# Patient Record
Sex: Male | Born: 1965 | Race: Black or African American | Hispanic: No | Marital: Married | Smoking: Never smoker
Health system: Southern US, Community
[De-identification: ages and names within clinical notes are randomized; demographics above are authoritative.]

## PROBLEM LIST (undated history)

## (undated) DIAGNOSIS — I10 Essential (primary) hypertension: Secondary | ICD-10-CM

## (undated) DIAGNOSIS — E785 Hyperlipidemia, unspecified: Secondary | ICD-10-CM

## (undated) DIAGNOSIS — E782 Mixed hyperlipidemia: Secondary | ICD-10-CM

## (undated) DIAGNOSIS — E0800 Diabetes mellitus due to underlying condition with hyperosmolarity without nonketotic hyperglycemic-hyperosmolar coma (NKHHC): Secondary | ICD-10-CM

## (undated) DIAGNOSIS — G4730 Sleep apnea, unspecified: Secondary | ICD-10-CM

## (undated) DIAGNOSIS — G4733 Obstructive sleep apnea (adult) (pediatric): Secondary | ICD-10-CM

## (undated) DIAGNOSIS — Z6841 Body Mass Index (BMI) 40.0 and over, adult: Secondary | ICD-10-CM

## (undated) HISTORY — DX: Essential (primary) hypertension: I10

## (undated) HISTORY — DX: Hyperlipidemia, unspecified: E78.5

## (undated) HISTORY — PX: NO PAST SURGERIES: SHX2092

## (undated) HISTORY — DX: Mixed hyperlipidemia: E78.2

## (undated) HISTORY — DX: Sleep apnea, unspecified: G47.30

## (undated) HISTORY — PX: WRIST SURGERY: SHX841

## (undated) HISTORY — DX: Body Mass Index (BMI) 40.0 and over, adult: Z684

## (undated) HISTORY — DX: Obstructive sleep apnea (adult) (pediatric): G47.33

## (undated) HISTORY — PX: SHOULDER SURGERY: SHX246

## (undated) HISTORY — DX: Diabetes mellitus due to underlying condition with hyperosmolarity without nonketotic hyperglycemic-hyperosmolar coma (NKHHC): E08.00

---

## 2017-03-21 LAB — PSA, TOTAL AND FREE
PSA, FREE: 0.5
PSA, Total: 2.3

## 2017-07-12 LAB — THYROID PANEL WITH TSH
T3, Free: 3.1
T3, Reverse: 16.1
T4,FREE (DIRECT): 1.2
TSH: 1.03

## 2017-10-25 LAB — BASIC METABOLIC PANEL
Anion gap: 14
BUN: 29 — AB (ref 4–21)
CHLORIDE: 102
CREATINE, SERUM: 1.3
Calcium: 9.9
Carbon Dioxide, Total: 23
GFR CALC AF AMER: 70
Glucose: 102
POTASSIUM: 5.6
Sodium: 138

## 2017-10-25 LAB — LIPID PANEL
CHOLESTEROL, TOTAL: 159
Chol/HDL Ratio, serum: 3.5
HDL: 46
LDL: 93
Triglycerides: 100 (ref 40–160)

## 2017-10-25 LAB — HM HEPATITIS C SCREENING LAB: HM Hepatitis Screen: NEGATIVE

## 2017-10-25 LAB — TESTOSTERONE: TESTOSTERONE: 417

## 2017-10-25 LAB — HEMOGLOBIN A1C: HEMOGLOBIN A1C: 7.3

## 2018-04-06 ENCOUNTER — Encounter: Payer: Self-pay | Admitting: Family Medicine

## 2018-04-06 ENCOUNTER — Other Ambulatory Visit: Payer: Self-pay

## 2018-04-06 ENCOUNTER — Ambulatory Visit (INDEPENDENT_AMBULATORY_CARE_PROVIDER_SITE_OTHER): Payer: Federal, State, Local not specified - PPO | Admitting: Family Medicine

## 2018-04-06 VITALS — BP 113/75 | HR 88 | Temp 98.5°F | Resp 17 | Ht 71.0 in | Wt 279.8 lb

## 2018-04-06 DIAGNOSIS — I10 Essential (primary) hypertension: Secondary | ICD-10-CM

## 2018-04-06 DIAGNOSIS — Z6839 Body mass index (BMI) 39.0-39.9, adult: Secondary | ICD-10-CM | POA: Diagnosis not present

## 2018-04-06 DIAGNOSIS — E119 Type 2 diabetes mellitus without complications: Secondary | ICD-10-CM | POA: Diagnosis not present

## 2018-04-06 DIAGNOSIS — E291 Testicular hypofunction: Secondary | ICD-10-CM

## 2018-04-06 MED ORDER — SIMVASTATIN 40 MG PO TABS: 40.0000 mg | ORAL_TABLET | Freq: Every evening | ORAL | 1 refills | Status: DC

## 2018-04-06 MED ORDER — LISINOPRIL 40 MG PO TABS: 40.0000 mg | ORAL_TABLET | Freq: Every day | ORAL | 1 refills | Status: DC

## 2018-04-06 MED ORDER — AMLODIPINE BESYLATE 10 MG PO TABS: 10.0000 mg | ORAL_TABLET | Freq: Every day | ORAL | 1 refills | Status: DC

## 2018-04-06 MED ORDER — HYDROCHLOROTHIAZIDE 25 MG PO TABS: 25.0000 mg | ORAL_TABLET | Freq: Every day | ORAL | 1 refills | Status: DC

## 2018-04-06 MED ORDER — METFORMIN HCL 500 MG PO TABS: 500.0000 mg | ORAL_TABLET | Freq: Two times a day (BID) | ORAL | 1 refills | Status: DC

## 2018-04-06 MED ORDER — CARVEDILOL 12.5 MG PO TABS: 12.5000 mg | ORAL_TABLET | Freq: Two times a day (BID) | ORAL | 1 refills | Status: DC

## 2018-04-06 NOTE — Progress Notes (Signed)
Thomas Cummings, is a 52 y.o. male  ZOX:[id number]  WJX:[id number]  DOB - 12/10/1965  CC:  Chief Complaint  Patient presents with  . Establish Care  . Diabetes    doesn't check FSBS at home. no nausea, vomiting, numbness/tingling in his feet, polyuria, polydipsia  . Hypertension    BP readings have been ok at home. watching salt intake. denies chest pain, SHOB, headaches, lower extremity swelling  . Hyperlipidemia    no fam hx of chol. taking Simvastatin 40 mg       HPI: Thomas Cummings is a 52 y.o. male is here today to establish care.   Thomas Cummings has Type 2 diabetes mellitus without complication, without long-term current use of insulin (HCC); Essential hypertension; BMI 39.0-39.9,adult; and Testosterone deficiency in male on their problem list.    Today's visit:  Thomas Cummings is new to [facility] wishes to establish care. 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 He has had recent normal lipid panel,  thyroid, BMP which were normal. Last A!C 7.3 indicating controlled diabetes. Denies polyuria, polydipsia, or polyphagia.  Last PSA over 1 year ago which as normal. He denies any concerns. Adherent with current medication regimen. He was prescribed Arimidex for low T, however wishes to discontinue if not needed. He has not taken Arimidex for over one month. Thomas Cummings reports that he is inactive for routine exercise. He is a non-smoker. Makes efforts to adhere to a low sodium diet. Denies new headaches, chest pain, abdominal pain, nausea, new weakness , numbness or tingling, SOB, edema, or worrisome cough.    Current medications: Current Outpatient Medications:  .  amLODipine (NORVASC) 10 MG tablet, Take 1 tablet (10 mg total) by mouth daily., Disp: 90 tablet, Rfl: 1 .  anastrozole (ARIMIDEX) 1 MG tablet, Take 0.5 tablets by mouth every other day., Disp: , Rfl:  .  azelastine (ASTELIN) 0.1 % nasal spray, Place 2 sprays into both nostrils 2  (two) times daily., Disp: , Rfl: 11 .  carvedilol (COREG) 12.5 MG tablet, Take 1 tablet (12.5 mg total) by mouth 2 (two) times daily., Disp: 180 tablet, Rfl: 1 .  Coenzyme Q10 (COQ10) 100 MG CAPS, Take 1 capsule by mouth daily., Disp: , Rfl:  .  hydrochlorothiazide (HYDRODIURIL) 25 MG tablet, Take 1 tablet (25 mg total) by mouth daily., Disp: 90 tablet, Rfl: 1 .  lisinopril (PRINIVIL,ZESTRIL) 40 MG tablet, Take 1 tablet (40 mg total) by mouth daily., Disp: 90 tablet, Rfl: 1 .  metFORMIN (GLUCOPHAGE) 500 MG tablet, Take 1 tablet (500 mg total) by mouth 2 (two) times daily with a meal., Disp: 180 tablet, Rfl: 1 .  simvastatin (ZOCOR) 40 MG tablet, Take 1 tablet (40 mg total) by mouth every evening., Disp: 90 tablet, Rfl: 1   Pertinent family medical history: family history includes Alzheimer's disease in his father; Diabetes in his mother.   No Known Allergies  Social History   Socioeconomic History  . Marital status: Married    Spouse name: Not on file  . Number of children: Not on file  . Years of education: Not on file  . Highest education level: Not on file  Occupational History  . Not on file  Social Needs  . Financial resource strain: Not on file  . Food insecurity:    Worry: Not on file    Inability: Not on file  . Transportation needs:    Medical: Not on file  Non-medical: Not on file  Tobacco Use  . Smoking status: Never Smoker  . Smokeless tobacco: Never Used  Substance and Sexual Activity  . Alcohol use: Never    Frequency: Never  . Drug use: Never  . Sexual activity: Yes    Partners: Female  Lifestyle  . Physical activity:    Days per week: Not on file    Minutes per session: Not on file  . Stress: Not on file  Relationships  . Social connections:    Talks on phone: Not on file    Gets together: Not on file    Attends religious service: Not on file    Active member of club or organization: Not on file    Attends meetings of clubs or organizations: Not on  file    Relationship status: Not on file  . Intimate partner violence:    Fear of current or ex partner: Not on file    Emotionally abused: Not on file    Physically abused: Not on file    Forced sexual activity: Not on file  Other Topics Concern  . Not on file  Social History Narrative  . Not on file    Review of Systems: Constitutional: Negative for fever, chills, diaphoresis, activity change, appetite change and fatigue. HENT: Negative for ear pain, nosebleeds, congestion, facial swelling, rhinorrhea, neck pain, neck stiffness and ear discharge.  Eyes: Negative for pain, discharge, redness, itching and visual disturbance. Respiratory: Negative for cough, choking, chest tightness, shortness of breath, wheezing and stridor.  Cardiovascular: Negative for chest pain, palpitations and leg swelling. Gastrointestinal: Negative for abdominal distention. Genitourinary: Negative for dysuria, urgency, frequency, hematuria, flank pain, decreased urine volume, difficulty urinating. Musculoskeletal: Negative for back pain, joint swelling, arthralgia and gait problem. Neurological: Negative for dizziness, tremors, seizures, syncope, facial asymmetry, speech difficulty, weakness, light-headedness, numbness and headaches.  Hematological: Negative for adenopathy. Does not bruise/bleed easily. Psychiatric/Behavioral: Negative for hallucinations, behavioral problems, confusion, dysphoric mood, decreased concentration and agitation.    Objective:   Vitals:   04/06/18 1358  BP: 113/75  Pulse: 88  Resp: 17  Temp: 98.5 F (36.9 C)  SpO2: 95%    BP Readings from Last 3 Encounters:  04/06/18 113/75    Filed Weights   04/06/18 1358  Weight: 279 lb 12.8 oz (126.9 kg)      Physical Exam: Constitutional: Patient appears well-developed and well-nourished. No distress. HENT: Normocephalic, atraumatic, External right and left ear normal. Oropharynx is clear and moist.  Eyes: Conjunctivae and EOM  are normal. PERRLA, no scleral icterus. Neck: Normal ROM. Neck supple. No JVD. No tracheal deviation. No thyromegaly. CVS: RRR, S1/S2 +, no murmurs, no gallops, no carotid bruit.  Pulmonary: Effort and breath sounds normal, no stridor, rhonchi, wheezes, rales.  Abdominal: Soft. BS +, no distension, tenderness, rebound or guarding.  Musculoskeletal: Normal range of motion. No edema and no tenderness.  Neuro: Alert. Normal muscle tone coordination. Normal gait. BUE and BLE strength 5/5. Bilateral hand grips symmetrical. No cranial nerve deficit. Skin: Skin is warm and dry. No rash noted. Not diaphoretic. No erythema. No pallor. Psychiatric: Normal mood and affect. Behavior, judgment, thought content normal.  Lab Results (prior encounters)  No results found for: WBC, HGB, HCT, MCV, PLT Lab Results  Component Value Date   BUN 29 (A) 10/25/2017   NA 138 10/25/2017   K 5.6 10/25/2017   CL 102 10/25/2017   CO2 23 10/25/2017    Lab Results  Component Value Date  HGBA1C 7.3 10/25/2017       Component Value Date/Time   CHOL 159 10/25/2017   TRIG 100 10/25/2017   CHOLHDL 3.5 10/25/2017        Assessment and plan:  1. Type 2 diabetes mellitus without complication, without long-term current use of insulin (HCC), well-controlled previously and last A1C 7.3 in May. Will repeat an A1C today and CMP. Aim for 30 minutes of exercise most days, with a goal of 150 minutes per week. -Glucose monitoring at minimal of twice daily and keep a log of readings. -Commit to medication adherence and self-adjustment as needed -increase foods containing whole grains (one-half of grain intake). -saturated fat intake should be reduced -reduce intake of trans fat (lowers LDL cholesterol and increases HDL cholesterol) -Eat 4-5 small meals during the day to reduce the risk of becoming hungry.   2. Essential hypertension, well-controlled.  We have discussed target BP range and blood pressure goal. I have  advised patient to check BP regularly and to call us back or report to clinic if the numbers are consistently higher than 140/90. We discussed the importance of compliance with medical therapy and DASH diet recommended, consequences of uncontrolled hypertension discussed.  -Continue current BP medications  3. Hyperlipidemia, currently well controlled with regimen. Recent lipid panel normal.   Meds ordered this encounter  Medications  . amLODipine (NORVASC) 10 MG tablet    Sig: Take 1 tablet (10 mg total) by mouth daily.    Dispense:  90 tablet    Refill:  1  . carvedilol (COREG) 12.5 MG tablet    Sig: Take 1 tablet (12.5 mg total) by mouth 2 (two) times daily.    Dispense:  180 tablet    Refill:  1  . hydrochlorothiazide (HYDRODIURIL) 25 MG tablet    Sig: Take 1 tablet (25 mg total) by mouth daily.    Dispense:  90 tablet    Refill:  1  . lisinopril (PRINIVIL,ZESTRIL) 40 MG tablet    Sig: Take 1 tablet (40 mg total) by mouth daily.    Dispense:  90 tablet    Refill:  1  . metFORMIN (GLUCOPHAGE) 500 MG tablet    Sig: Take 1 tablet (500 mg total) by mouth 2 (two) times daily with a meal.    Dispense:  180 tablet    Refill:  1  . simvastatin (ZOCOR) 40 MG tablet    Sig: Take 1 tablet (40 mg total) by mouth every evening.    Dispense:  90 tablet    Refill:  1    Orders Placed This Encounter  Procedures  . Hemoglobin A1c    Standing Status:   Future    Standing Expiration Date:   04/07/2019  . Comprehensive metabolic panel    Standing Status:   Future    Standing Expiration Date:   04/07/2019  . TestT+TestF+SHBG    Standing Status:   Future    Standing Expiration Date:   04/07/2019    Return in about 6 months (around 10/06/2018), or DM and HTN.   A total of 35  minutes spent, greater than 50 % of this time was spent counseling and coordination of care.  The patient was given clear instructions to go to ER or return to medical center if symptoms don't improve, worsen or  new problems develop. The patient verbalized understanding. The patient was advised  to call and obtain lab results if they haven't heard anything from out office within 7-10 business  days.  Joaquin Courts, FNP Primary Care at [facility] [address] [place] [address] [phone]: [phone]    This note has been created with Dragon speech recognition software and Paediatric nurse. Any transcriptional errors are unintentional.

## 2018-04-06 NOTE — Patient Instructions (Signed)
Thank you for choosing Primary Care at [facility] for your medical home!    Thomas Cummings was seen by Joaquin Courts, FNP today.   Thomas Cummings's primary care provider is Bing Neighbors, FNP.   For the best care possible,  you should try to see Joaquin Courts, FNP  whenever you come to clinic.   We look forward to seeing you again soon!  If you have any questions about your visit today,  please call us at   Or feel free to reach your provider via MyChart.     Health Maintenance, Male A healthy lifestyle and preventive care is important for your health and wellness. Ask your health care provider about what schedule of regular examinations is right for you. What should I know about weight and diet? Eat a Healthy Diet  Eat plenty of vegetables, fruits, whole grains, low-fat dairy products, and lean protein.  Do not eat a lot of foods high in solid fats, added sugars, or salt.  Maintain a Healthy Weight Regular exercise can help you achieve or maintain a healthy weight. You should:  Do at least 150 minutes of exercise each week. The exercise should increase your heart rate and make you sweat (moderate-intensity exercise).  Do strength-training exercises at least twice a week.  Watch Your Levels of Cholesterol and Blood Lipids  Have your blood tested for lipids and cholesterol every 5 years starting at 54 years of age. If you are at high risk for heart disease, you should start having your blood tested when you are 54 years old. You may need to have your cholesterol levels checked more often if: ? Your lipid or cholesterol levels are high. ? You are older than 54 years of age. ? You are at high risk for heart disease.  What should I know about cancer screening? Many types of cancers can be detected early and may often be prevented. Lung Cancer  You should be screened every year for lung cancer if: ? You are a current smoker who has smoked for at least 30  years. ? You are a former smoker who has quit within the past 15 years.  Talk to your health care provider about your screening options, when you should start screening, and how often you should be screened.  Colorectal Cancer  Routine colorectal cancer screening usually begins at 54 years of age and should be repeated every 5-10 years until you are 55 years old. You may need to be screened more often if early forms of precancerous polyps or small growths are found. Your health care provider may recommend screening at an earlier age if you have risk factors for colon cancer.  Your health care provider may recommend using home test kits to check for hidden blood in the stool.  A small camera at the end of a tube can be used to examine your colon (sigmoidoscopy or colonoscopy). This checks for the earliest forms of colorectal cancer.  Prostate and Testicular Cancer  Depending on your age and overall health, your health care provider may do certain tests to screen for prostate and testicular cancer.  Talk to your health care provider about any symptoms or concerns you have about testicular or prostate cancer.  Skin Cancer  Check your skin from head to toe regularly.  Tell your health care provider about any new moles or changes in moles, especially if: ? There is a change in a mole's size, shape, or color. ? You have a  mole that is larger than a pencil eraser.  Always use sunscreen. Apply sunscreen liberally and repeat throughout the day.  Protect yourself by wearing long sleeves, pants, a wide-brimmed hat, and sunglasses when outside.  What should I know about heart disease, diabetes, and high blood pressure?  If you are 64-68 years of age, have your blood pressure checked every 3-5 years. If you are 79 years of age or older, have your blood pressure checked every year. You should have your blood pressure measured twice-once when you are at a hospital or clinic, and once when you are  not at a hospital or clinic. Record the average of the two measurements. To check your blood pressure when you are not at a hospital or clinic, you can use: ? An automated blood pressure machine at a pharmacy. ? A home blood pressure monitor.  Talk to your health care provider about your target blood pressure.  If you are between 86-24 years old, ask your health care provider if you should take aspirin to prevent heart disease.  Have regular diabetes screenings by checking your fasting blood sugar level. ? If you are at a normal weight and have a low risk for diabetes, have this test once every three years after the age of 29. ? If you are overweight and have a high risk for diabetes, consider being tested at a younger age or more often.  A one-time screening for abdominal aortic aneurysm (AAA) by ultrasound is recommended for men aged 65-75 years who are current or former smokers. What should I know about preventing infection? Hepatitis B If you have a higher risk for hepatitis B, you should be screened for this virus. Talk with your health care provider to find out if you are at risk for hepatitis B infection. Hepatitis C Blood testing is recommended for:  Everyone born from [birth year] through 1967.  Anyone with known risk factors for hepatitis C.  Sexually Transmitted Diseases (STDs)  You should be screened each year for STDs including gonorrhea and chlamydia if: ? You are sexually active and are younger than 54 years of age. ? You are older than 54 years of age and your health care provider tells you that you are at risk for this type of infection. ? Your sexual activity has changed since you were last screened and you are at an increased risk for chlamydia or gonorrhea. Ask your health care provider if you are at risk.  Talk with your health care provider about whether you are at high risk of being infected with HIV. Your health care provider may recommend a prescription medicine to help  prevent HIV infection.  What else can I do?  Schedule regular health, dental, and eye exams.  Stay current with your vaccines (immunizations).  Do not use any tobacco products, such as cigarettes, chewing tobacco, and e-cigarettes. If you need help quitting, ask your health care provider.  Limit alcohol intake to no more than 2 drinks per day. One drink equals 12 ounces of beer, 5 ounces of wine, or 1 ounces of hard liquor.  Do not use street drugs.  Do not share needles.  Ask your health care provider for help if you need support or information about quitting drugs.  Tell your health care provider if you often feel depressed.  Tell your health care provider if you have ever been abused or do not feel safe at home. This information is not intended to replace advice given to you by  your health care provider. Make sure you discuss any questions you have with your health care provider. Document Released: 11/27/2007 Document Revised: 01/28/2016 Document Reviewed: 03/04/2015 Elsevier Interactive Patient Education  [employer].

## 2018-04-06 NOTE — Addendum Note (Signed)
Addended by: Heidi Dach on: 04/06/2018 11:55 AM   Modules accepted: Orders

## 2018-04-07 ENCOUNTER — Other Ambulatory Visit: Payer: Self-pay | Admitting: Family Medicine

## 2018-04-07 DIAGNOSIS — E119 Type 2 diabetes mellitus without complications: Secondary | ICD-10-CM | POA: Diagnosis not present

## 2018-04-08 DIAGNOSIS — Z6839 Body mass index (BMI) 39.0-39.9, adult: Secondary | ICD-10-CM | POA: Insufficient documentation

## 2018-04-08 DIAGNOSIS — I10 Essential (primary) hypertension: Secondary | ICD-10-CM | POA: Insufficient documentation

## 2018-04-08 DIAGNOSIS — E291 Testicular hypofunction: Secondary | ICD-10-CM | POA: Insufficient documentation

## 2018-04-08 DIAGNOSIS — E119 Type 2 diabetes mellitus without complications: Secondary | ICD-10-CM | POA: Insufficient documentation

## 2018-04-08 LAB — COMPREHENSIVE METABOLIC PANEL
A/G RATIO: 1.3 (ref 1.2–2.2)
ALT: 35 IU/L (ref 0–44)
AST: 21 IU/L (ref 0–40)
Albumin: 4.3 g/dL (ref 3.5–5.5)
Alkaline Phosphatase: 82 IU/L (ref 39–117)
BILIRUBIN TOTAL: 0.6 mg/dL (ref 0.0–1.2)
BUN / CREAT RATIO: 17 (ref 9–20)
BUN: 20 mg/dL (ref 6–24)
CHLORIDE: 101 mmol/L (ref 96–106)
CO2: 21 mmol/L (ref 20–29)
Calcium: 10 mg/dL (ref 8.7–10.2)
Creatinine, Ser: 1.17 mg/dL (ref 0.76–1.27)
GFR calc non Af Amer: 71 mL/min/{1.73_m2} (ref >59)
GFR, EST AFRICAN AMERICAN: 82 mL/min/{1.73_m2} (ref >59)
Globulin, Total: 3.2 g/dL (ref 1.5–4.5)
Glucose: 109 mg/dL — ABNORMAL HIGH (ref 65–99)
Potassium: 5 mmol/L (ref 3.5–5.2)
Sodium: 140 mmol/L (ref 134–144)
TOTAL PROTEIN: 7.5 g/dL (ref 6.0–8.5)

## 2018-04-08 LAB — HGB A1C W/O EAG: Hgb A1c MFr Bld: 7.4 % — ABNORMAL HIGH (ref 4.8–5.6)

## 2018-04-12 NOTE — Progress Notes (Signed)
Patient notified of results & recommendations. Expressed understanding.

## 2018-05-15 ENCOUNTER — Telehealth: Payer: Self-pay | Admitting: Family Medicine

## 2018-05-15 DIAGNOSIS — G4733 Obstructive sleep apnea (adult) (pediatric): Secondary | ICD-10-CM

## 2018-05-15 NOTE — Telephone Encounter (Signed)
Please contact patient to advise I received a copy of a sleep study analysis ordered by a other provider.  Inquire if that provider that initiated the study has already followed up on the study with a sleep medicine specialist.  If not I will go ahead and process referral for patient to be further evaluated [facility]. 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   Joaquin CourtsKimberly Courteny Egler, FNP [facility] [address], [place] [id number] [phone]: [phone]

## 2018-05-16 NOTE — Addendum Note (Signed)
Addended by: Bing NeighborsHARRIS, Kanyla Omeara S on: 05/16/2018 03:09 PM   Modules accepted: Orders

## 2018-05-16 NOTE — Telephone Encounter (Signed)
Patient will need to be referred to Sleep medicine for consultation and recommendation for CPAP. I will place the order for the referral to sleep medicine which [facility].

## 2018-05-16 NOTE — Telephone Encounter (Signed)
Patient states that no follow up has been done. He wanted you to look at it to see if he needs to be on a CPAP machine. 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

## 2018-05-17 NOTE — Telephone Encounter (Signed)
Patient notified of this information.

## 2018-05-23 DIAGNOSIS — G4733 Obstructive sleep apnea (adult) (pediatric): Secondary | ICD-10-CM | POA: Diagnosis not present

## 2018-05-26 DIAGNOSIS — G4733 Obstructive sleep apnea (adult) (pediatric): Secondary | ICD-10-CM | POA: Diagnosis not present

## 2018-11-08 ENCOUNTER — Other Ambulatory Visit: Payer: Self-pay | Admitting: Family Medicine

## 2018-11-08 ENCOUNTER — Telehealth: Payer: Self-pay

## 2018-11-08 NOTE — Telephone Encounter (Signed)
Notify patient that he will need an office prior to any other medications being refilled.  Refilled requested medications for 30 days.

## 2018-11-08 NOTE — Telephone Encounter (Signed)

## 2018-11-09 ENCOUNTER — Encounter: Payer: Self-pay | Admitting: Family Medicine

## 2018-11-09 ENCOUNTER — Other Ambulatory Visit: Payer: Self-pay

## 2018-11-09 ENCOUNTER — Ambulatory Visit (INDEPENDENT_AMBULATORY_CARE_PROVIDER_SITE_OTHER): Payer: Federal, State, Local not specified - PPO | Admitting: Family Medicine

## 2018-11-09 VITALS — BP 122/80 | HR 76 | Temp 98.3°F | Resp 17 | Ht 71.0 in | Wt 285.0 lb

## 2018-11-09 DIAGNOSIS — G4733 Obstructive sleep apnea (adult) (pediatric): Secondary | ICD-10-CM | POA: Diagnosis not present

## 2018-11-09 DIAGNOSIS — E785 Hyperlipidemia, unspecified: Secondary | ICD-10-CM | POA: Diagnosis not present

## 2018-11-09 DIAGNOSIS — E119 Type 2 diabetes mellitus without complications: Secondary | ICD-10-CM

## 2018-11-09 DIAGNOSIS — Z9989 Dependence on other enabling machines and devices: Secondary | ICD-10-CM

## 2018-11-09 DIAGNOSIS — Z1329 Encounter for screening for other suspected endocrine disorder: Secondary | ICD-10-CM | POA: Diagnosis not present

## 2018-11-09 DIAGNOSIS — E291 Testicular hypofunction: Secondary | ICD-10-CM

## 2018-11-09 DIAGNOSIS — Z1211 Encounter for screening for malignant neoplasm of colon: Secondary | ICD-10-CM

## 2018-11-09 MED ORDER — SIMVASTATIN 40 MG PO TABS: 40.0000 mg | ORAL_TABLET | Freq: Every evening | ORAL | 3 refills | Status: DC

## 2018-11-09 MED ORDER — SPIRONOLACTONE 25 MG PO TABS: 25.0000 mg | ORAL_TABLET | Freq: Two times a day (BID) | ORAL | 1 refills | Status: DC

## 2018-11-09 NOTE — Patient Instructions (Addendum)
Return in September 2020 for your annual flu vaccination.  I have referred you to ophthalmology for a diabetic eye exam they will contact you to schedule an appointment.    For now no changes in medicine you will be notified of your lab results and if any changes in therapy are warranted.     Health Maintenance, Male A healthy lifestyle and preventive care is important for your health and wellness. Ask your health care provider about what schedule of regular examinations is right for you. What should I know about weight and diet? Eat a Healthy Diet  Eat plenty of vegetables, fruits, whole grains, low-fat dairy products, and lean protein.  Do not eat a lot of foods high in solid fats, added sugars, or salt.  Maintain a Healthy Weight Regular exercise can help you achieve or maintain a healthy weight. You should:  Do at least 150 minutes of exercise each week. The exercise should increase your heart rate and make you sweat (moderate-intensity exercise).  Do strength-training exercises at least twice a week. Watch Your Levels of Cholesterol and Blood Lipids  Have your blood tested for lipids and cholesterol every 5 years starting at 55 years of age. If you are at high risk for heart disease, you should start having your blood tested when you are 55 years old. You may need to have your cholesterol levels checked more often if: ? Your lipid or cholesterol levels are high. ? You are older than 55 years of age. ? You are at high risk for heart disease. What should I know about cancer screening? Many types of cancers can be detected early and may often be prevented. Lung Cancer  You should be screened every year for lung cancer if: ? You are a current smoker who has smoked for at least 30 years. ? You are a former smoker who has quit within the past 15 years.  Talk to your health care provider about your screening options, when you should start screening, and how often you should be  screened. Colorectal Cancer  Routine colorectal cancer screening usually begins at 55 years of age and should be repeated every 5-10 years until you are 55 years old. You may need to be screened more often if early forms of precancerous polyps or small growths are found. Your health care provider may recommend screening at an earlier age if you have risk factors for colon cancer.  Your health care provider may recommend using home test kits to check for hidden blood in the stool.  A small camera at the end of a tube can be used to examine your colon (sigmoidoscopy or colonoscopy). This checks for the earliest forms of colorectal cancer. Prostate and Testicular Cancer  Depending on your age and overall health, your health care provider may do certain tests to screen for prostate and testicular cancer.  Talk to your health care provider about any symptoms or concerns you have about testicular or prostate cancer. Skin Cancer  Check your skin from head to toe regularly.  Tell your health care provider about any new moles or changes in moles, especially if: ? There is a change in a mole's size, shape, or color. ? You have a mole that is larger than a pencil eraser.  Always use sunscreen. Apply sunscreen liberally and repeat throughout the day.  Protect yourself by wearing long sleeves, pants, a wide-brimmed hat, and sunglasses when outside. What should I know about heart disease, diabetes, and high blood  pressure?  If you are 69-59 years of age, have your blood pressure checked every 3-5 years. If you are 17 years of age or older, have your blood pressure checked every year. You should have your blood pressure measured twice-once when you are at a hospital or clinic, and once when you are not at a hospital or clinic. Record the average of the two measurements. To check your blood pressure when you are not at a hospital or clinic, you can use: ? An automated blood pressure machine at a  pharmacy. ? A home blood pressure monitor.  Talk to your health care provider about your target blood pressure.  If you are between 24-45 years old, ask your health care provider if you should take aspirin to prevent heart disease.  Have regular diabetes screenings by checking your fasting blood sugar level. ? If you are at a normal weight and have a low risk for diabetes, have this test once every three years after the age of 73. ? If you are overweight and have a high risk for diabetes, consider being tested at a younger age or more often.  A one-time screening for abdominal aortic aneurysm (AAA) by ultrasound is recommended for men aged 66-75 years who are current or former smokers. What should I know about preventing infection? Hepatitis B If you have a higher risk for hepatitis B, you should be screened for this virus. Talk with your health care provider to find out if you are at risk for hepatitis B infection. Hepatitis C Blood testing is recommended for:  Everyone born from [birth year] through 1967.  Anyone with known risk factors for hepatitis C. Sexually Transmitted Diseases (STDs)  You should be screened each year for STDs including gonorrhea and chlamydia if: ? You are sexually active and are younger than 55 years of age. ? You are older than 55 years of age and your health care provider tells you that you are at risk for this type of infection. ? Your sexual activity has changed since you were last screened and you are at an increased risk for chlamydia or gonorrhea. Ask your health care provider if you are at risk.  Talk with your health care provider about whether you are at high risk of being infected with HIV. Your health care provider may recommend a prescription medicine to help prevent HIV infection. What else can I do?  Schedule regular health, dental, and eye exams.  Stay current with your vaccines (immunizations).  Do not use any tobacco products, such as cigarettes,  chewing tobacco, and e-cigarettes. If you need help quitting, ask your health care provider.  Limit alcohol intake to no more than 2 drinks per day. One drink equals 12 ounces of beer, 5 ounces of wine, or 1 ounces of hard liquor.  Do not use street drugs.  Do not share needles.  Ask your health care provider for help if you need support or information about quitting drugs.  Tell your health care provider if you often feel depressed.  Tell your health care provider if you have ever been abused or do not feel safe at home. This information is not intended to replace advice given to you by your health care provider. Make sure you discuss any questions you have with your health care provider. Document Released: 11/27/2007 Document Revised: 01/28/2016 Document Reviewed: 03/04/2015 Elsevier Interactive Patient Education  2019 [employer] Inc[employer]     Stool DNA Testing for Colon Cancer Why am I having this  test? Colon cancer is the second-leading cause of cancer deaths in men and women. Testing for colon cancer before symptoms develop (screening) reduces your risk for this cancer. Stool DNA testing, also called the FIT-DNA test, is one method of screening for colon cancer. Colon cancer grows slowly, so finding the cancer early means a better chance for effective treatment. All adults should have colon cancer screening starting at age 31 and continuing until age 29. Your health care provider may recommend screening at age 10. Screening may include having stool DNA testing every 3 years if:  You have no symptoms of colon cancer. Symptoms include rectal bleeding, weight loss, and changes in bowel habits.  You have an average risk of colon cancer. Average risk means: ? You do not have precancerous polyps. ? You do not have family or personal history of either colon cancer or a colon disease that increases your risk for colon cancer. What is being tested? For the test, a sample of your stool (feces)  is checked for blood and changes in DNA that could lead to cancer. Growths in your colon that are cancerous (malignant) or may become cancerous (precancerous polyps) bleed and shed cells. Blood and cells can be picked up by stool as it passes through your colon. This test checks for blood cells as well as nine types of DNA (biomarkers) in three genes that have been linked to colon cancer and precancerous polyps. What kind of sample is taken?  This test uses a stool sample that is collected when you have a bowel movement. How do I collect samples at home? You will be sent a stool sample collection kit in the mail. It will include instructions and everything you need to get the sample. Instructions may include these steps:  Store the kit in a dry place at room temperature until you are ready to collect the sample. Keep the kit away from heat and sunlight.  To collect the sample, place the collection device over the toilet.  Collect the sample according to the instructions that came with your kit.  After collecting a stool sample, follow instructions carefully regarding mixing the stool with a preservative and sealing the sample.  Send the sample to the lab in the postage-paid box that was included in the kit. Your stool sample will be checked within 3 days. The results will be sent to your health care provider. How do I prepare for this test? There is no preparation required for this test. However, do not collect a stool sample if:  You have bleeding hemorrhoids.  You have any rectal bleeding.  You have a cut on your hand or finger.  You have your menstrual period.  You have diarrhea. How are the results reported? Your test results will be reported as either positive or negative. It is up to you to get your test results. Ask your health care provider, or the department that is doing the test, when your results will be ready. What do the results mean?  A positive result means that the  test found abnormal DNA, blood cells, or both. If you have a positive result, you will need to have a follow-up exam of your colon done with a scope (colonoscopy).  A negative result means that no blood or changes in DNA were found. This does not guarantee that you do not have colon cancer. Your health care provider may recommend that you have other screening tests. Talk with your health care provider to discuss your  results, treatment options, and if necessary, the need for more tests. Talk with your health care provider if you have any questions about your results. This information is not intended to replace advice given to you by your health care provider. Make sure you discuss any questions you have with your health care provider. Document Released: 02/19/2016 Document Revised: 07/21/2017 Document Reviewed: 02/19/2016 Elsevier Interactive Patient Education  2019 [employer].

## 2018-11-09 NOTE — Progress Notes (Signed)
Patient ID: Thomas Cummings, male    DOB: [birth year], 53 y.o.   MRN: [id number]  PCP: Bing Neighbors, FNP  Chief Complaint  Patient presents with  . Diabetes  . Hypertension  . Hyperlipidemia    Subjective:  HPI  Thomas Cummings is a 53 y.o. male presents for diabetes, hyperlipidemia, and hypertension follow-up.  [name] has Type 2 diabetes mellitus without complication, without long-term current use of insulin (HCC); Essential hypertension; BMI 39.0-39.9,adult; and Testosterone deficiency in male on their problem list.   Hypertension Recheck checks blood pressure at home.  Readings are consistently less than 130/90.  Blood pressure is currently controlled with carvedilol, amlodipine, and hydrochlorothiazide.  Denies any adverse effects.  Denies chest pain, headache, shortness of breath, edema, or dizziness.  He makes efforts to reduce sodium.  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  Current Body mass index is 39.75 kg/m.  He also suffers from sleep apnea and recently was able to obtain his CPAP machine which is improve the quality of his rest.       Diabetes  Last A1c 7.3.  Patient does not monitor his blood glucose levels at home.  Reports compliance with medication regimen. Currently prescribed metformin 500 mg twice daily.  Denies 3P's or neuropathic pain.  Overdue health maintenance Patient is overdue for colonoscopy has never had one previously.  Patient reports no personal or family history of colon cancer.  He would like to move forward with a Cologuard screening.   Diabetic eye exam, overdue. Social History   Socioeconomic History  . Marital status: Married    Spouse name: Not on file  . Number of children: Not on file  . Years of education: Not on file  . Highest education level: Not on file  Occupational History  . Not on file  Social Needs  . Financial resource strain: Not on  file  . Food insecurity:    Worry: Not on file    Inability: Not on file  . Transportation needs:    Medical: Not on file    Non-medical: Not on file  Tobacco Use  . Smoking status: Never Smoker  . Smokeless tobacco: Never Used  Substance and Sexual Activity  . Alcohol use: Never    Frequency: Never  . Drug use: Never  . Sexual activity: Yes    Partners: Female  Lifestyle  . Physical activity:    Days per week: Not on file    Minutes per session: Not on file  . Stress: Not on file  Relationships  . Social connections:    Talks on phone: Not on file    Gets together: Not on file    Attends religious service: Not on file    Active member of club or organization: Not on file    Attends meetings of clubs or organizations: Not on file    Relationship status: Not on file  . Intimate partner violence:    Fear of current or ex partner: Not on file    Emotionally abused: Not on file    Physically abused: Not on file    Forced sexual activity: Not on file  Other Topics Concern  . Not on file  Social History Narrative  . Not on file    Family History  Problem Relation Age of Onset  . Diabetes Mother   . Alzheimer's disease Father  Review of Systems Pertinent negatives listed in HPI No Known Allergies  Prior to Admission medications   Medication Sig Start Date End Date Taking? Authorizing Provider  amLODipine (NORVASC) 10 MG tablet Take 1 tablet (10 mg total) by mouth daily. 04/06/18  Yes Bing NeighborsHarris, Duell Holdren S, FNP  anastrozole (ARIMIDEX) 1 MG tablet Take 0.5 tablets by mouth every other day. 01/13/18  Yes [provider]  azelastine (ASTELIN) 0.1 % nasal spray Place 2 sprays into both nostrils 2 (two) times daily. 03/28/18  Yes [provider]  carvedilol (COREG) 12.5 MG tablet Take 1 tablet by mouth twice daily 11/08/18  Yes Bing NeighborsHarris, Neftali Abair S, FNP  Coenzyme Q10 (COQ10) 100 MG CAPS Take 1 capsule by mouth daily.   Yes [provider]   hydrochlorothiazide (HYDRODIURIL) 25 MG tablet Take 1 tablet by mouth once daily 11/08/18  Yes Bing NeighborsHarris, Mertha Clyatt S, FNP  lisinopril (PRINIVIL,ZESTRIL) 40 MG tablet Take 1 tablet (40 mg total) by mouth daily. 04/06/18  Yes Bing NeighborsHarris, Pattrick Bady S, FNP  metFORMIN (GLUCOPHAGE) 500 MG tablet TAKE 1 TABLET BY MOUTH TWICE DAILY WITH MEALS 11/08/18  Yes Bing NeighborsHarris, Sinclair Arrazola S, FNP  simvastatin (ZOCOR) 40 MG tablet TAKE 1 TABLET BY MOUTH ONCE DAILY IN THE EVENING 11/08/18  Yes Bing NeighborsHarris, Floyed Masoud S, FNP  spironolactone (ALDACTONE) 25 MG tablet Take 25 mg by mouth 2 (two) times daily.   Yes [provider]    Past Medical, Surgical Family and Social History reviewed and updated.    Objective:   Today's Vitals   11/09/18 0901  BP: 122/80  Pulse: 76  Resp: 17  Temp: 98.3 F (36.8 C)  TempSrc: Temporal  SpO2: 97%  Weight: 285 lb (129.3 kg)  Height: 5\' 11"  (1.803 m)    BP Readings from Last 3 Encounters:  11/09/18 122/80  04/06/18 113/75    Filed Weights   11/09/18 0901  Weight: 285 lb (129.3 kg)       Physical Exam General appearance: alert, well developed, well nourished, cooperative and in no distress Head: Normocephalic, without obvious abnormality, atraumatic Respiratory: Respirations even and unlabored, normal respiratory rate Heart: rate and rhythm normal. No gallop or murmurs noted on exam  Abdomen: BS +, no distention, no rebound tenderness, or no mass Extremities: No gross deformities Skin: Skin color, texture, turgor normal. No rashes seen  Psych: Appropriate mood and affect. Neurologic: Mental status: Alert, oriented to person, place, and time, thought content appropriate.  No results found for: POCGLU  Lab Results  Component Value Date   HGBA1C 7.4 (H) 04/07/2018    Assessment & Plan:  1. Testosterone deficiency in male, history of  - TestT+TestF+SHBG  2. Type 2 diabetes mellitus without complication, without long-term current use of insulin (HCC), previously  controlled however not at goal of less than 7.0.  Encourage routine physical activity to facilitate weight loss.  Encourage increasing fruits and vegetables and reducing intake processed foods.  No changes to medications today. - Comprehensive metabolic panel - Hemoglobin A1c - Ambulatory referral to Ophthalmology  3. Screening for thyroid disorder - Thyroid Panel With TSH  4. Hyperlipidemia, unspecified hyperlipidemia type See #2. Continue current statin therapy. - Lipid panel  5. Special screening for malignant neoplasms, colon - Cologuard  6. OSA on CPAP, stable   Return for follow-up in 6 months.  Patient advised to follow-up in 3 months for a influenza vaccine.   Joaquin CourtsKimberly Venezia Sargeant, FNP [facility] [address], [place] [id number] [phone]: [phone]

## 2018-11-16 LAB — COMPREHENSIVE METABOLIC PANEL WITH GFR
ALT: 19 [IU]/L (ref 0–44)
AST: 14 [IU]/L (ref 0–40)
Albumin/Globulin Ratio: 2.3 — ABNORMAL HIGH (ref 1.2–2.2)
Albumin: 4.4 g/dL (ref 3.8–4.9)
Alkaline Phosphatase: 78 [IU]/L (ref 39–117)
BUN/Creatinine Ratio: 26 — ABNORMAL HIGH (ref 9–20)
BUN: 42 mg/dL — ABNORMAL HIGH (ref 6–24)
Bilirubin Total: 0.4 mg/dL (ref 0.0–1.2)
CO2: 20 mmol/L (ref 20–29)
Calcium: 9.4 mg/dL (ref 8.7–10.2)
Chloride: 106 mmol/L (ref 96–106)
Creatinine, Ser: 1.62 mg/dL — ABNORMAL HIGH (ref 0.76–1.27)
GFR calc Af Amer: 55 mL/min/{1.73_m2} — ABNORMAL LOW
GFR calc non Af Amer: 47 mL/min/{1.73_m2} — ABNORMAL LOW
Globulin, Total: 1.9 g/dL (ref 1.5–4.5)
Glucose: 88 mg/dL (ref 65–99)
Potassium: 4.6 mmol/L (ref 3.5–5.2)
Sodium: 143 mmol/L (ref 134–144)
Total Protein: 6.3 g/dL (ref 6.0–8.5)

## 2018-11-16 LAB — THYROID PANEL WITH TSH
Free Thyroxine Index: 1.4 (ref 1.2–4.9)
T3 Uptake Ratio: 25 % (ref 24–39)
T4, Total: 5.7 ug/dL (ref 4.5–12.0)
TSH: 1.58 u[IU]/mL (ref 0.450–4.500)

## 2018-11-16 LAB — LIPID PANEL
Chol/HDL Ratio: 3.3 ratio (ref 0.0–5.0)
Cholesterol, Total: 123 mg/dL (ref 100–199)
HDL: 37 mg/dL — ABNORMAL LOW (ref >39)
LDL Calculated: 52 mg/dL (ref 0–99)
Triglycerides: 169 mg/dL — ABNORMAL HIGH (ref 0–149)
VLDL Cholesterol Cal: 34 mg/dL (ref 5–40)

## 2018-11-16 LAB — TESTT+TESTF+SHBG
Sex Hormone Binding: 49.4 nmol/L (ref 19.3–76.4)
Testosterone, Free: 4.7 pg/mL — ABNORMAL LOW (ref 7.2–24.0)
Testosterone, Total, LC/MS: 486.1 ng/dL (ref 264.0–916.0)

## 2018-11-16 LAB — HEMOGLOBIN A1C
Est. average glucose Bld gHb Est-mCnc: 189 mg/dL
Hgb A1c MFr Bld: 8.2 % — ABNORMAL HIGH (ref 4.8–5.6)

## 2018-11-18 ENCOUNTER — Other Ambulatory Visit: Payer: Self-pay | Admitting: Family Medicine

## 2018-11-18 NOTE — Telephone Encounter (Signed)
Total testosterone normal. A1C increased 8.2-increase metformin 1000 mg BID and reduce intake of foods high in carbohydrates.  Kidney function is also diminished mildly.  This could be a correlation to recent increase in A1c.  Would like to closely monitor renal function therefore return for lab appointment in 3 months to recheck kidney functioning.  Cholesterol abnormal triglycerides elevated and healthy cholesterol low which is different from prior labs over a year ago.  Would like to encourage better control of diabetes which will improve triglycerides.  Increase intake of vegetables and fruits and try to increase physical activity as much as possible during the week as this will also improve overall glycemic control.

## 2018-11-18 NOTE — Progress Notes (Signed)
Total testosterone normal. A1C increased 8.2-increase metformin 1000 mg BID and reduce intake of foods high in carbohydrates.  Kidney function is also diminished mildly.  This could be a correlation to recent increase in A1c.  Would like to closely monitor renal function therefore return for lab appointment in 3 months to recheck kidney functioning.  Cholesterol abnormal triglycerides elevated and healthy cholesterol low which is different from prior labs over a year ago.  Would like to encourage better control of diabetes which will improve triglycerides.  Increase intake of vegetables and fruits and try to increase physical activity as much as possible during the week as this will also improve overall glycemic control. 

## 2018-11-20 ENCOUNTER — Telehealth: Payer: Self-pay | Admitting: Family Medicine

## 2018-11-20 MED ORDER — METFORMIN HCL 1000 MG PO TABS: 1000.0000 mg | ORAL_TABLET | Freq: Two times a day (BID) | ORAL | 1 refills | Status: DC

## 2018-11-20 NOTE — Telephone Encounter (Signed)
Patient notified of lab results & recommendations. Expressed understanding. Is agreeable to new Metformin dose. Prescription sent to pharmacy on file.

## 2018-11-20 NOTE — Telephone Encounter (Signed)
Pt called wanting to speak about his lab results please follow up

## 2018-11-21 NOTE — Telephone Encounter (Signed)
November 20, 2018  Me  5:38 PM  Note    Patient notified of lab results & recommendations. Expressed understanding. Is agreeable to new Metformin dose. Prescription sent to pharmacy on file.

## 2018-12-22 LAB — COLOGUARD: Cologuard: NEGATIVE

## 2018-12-25 ENCOUNTER — Telehealth: Payer: Self-pay | Admitting: Family Medicine

## 2018-12-25 NOTE — Telephone Encounter (Signed)
Notify patient of negative Cologuard testing.  He will need repeat in 3 years.  Update the system with his results.

## 2018-12-25 NOTE — Telephone Encounter (Signed)
Left voicemail asking patient to return call to [facility] at [phone]. Nat Christen, CMA

## 2018-12-25 NOTE — Telephone Encounter (Signed)
Updated HM with Cologuard

## 2018-12-27 NOTE — Telephone Encounter (Signed)
Patient notified of Cologuard results. Expressed understanding. Patient is aware that test will need to be repeated in 3 years.

## 2019-02-08 ENCOUNTER — Other Ambulatory Visit: Payer: Federal, State, Local not specified - PPO

## 2019-02-22 ENCOUNTER — Other Ambulatory Visit: Payer: Federal, State, Local not specified - PPO

## 2019-02-22 ENCOUNTER — Telehealth: Payer: Self-pay | Admitting: Family Medicine

## 2019-02-22 ENCOUNTER — Other Ambulatory Visit: Payer: Self-pay

## 2019-02-22 DIAGNOSIS — N289 Disorder of kidney and ureter, unspecified: Secondary | ICD-10-CM | POA: Diagnosis not present

## 2019-02-22 NOTE — Progress Notes (Signed)
Patient here for CMP to recheck renal function.

## 2019-02-22 NOTE — Telephone Encounter (Signed)
1) Medication(s) Requested (by name): simvastatin (ZOCOR) 40 MG tablet [[id number]] lisinopril (PRINIVIL,ZESTRIL) 40 MG tablet [[id number]]  carvedilol (COREG) 12.5 MG tablet [[id number]] hydrochlorothiazide (HYDRODIURIL) 25 MG tablet [[id number]] spironolactone (ALDACTONE) 25 MG tablet [[id number]]  amLODipine (NORVASC) 10 MG tablet [[id number]]  metFORMIN (GLUCOPHAGE) 1000 MG tablet [[id number]]  azelastine (ASTELIN) 0.1 % nasal spray [[id number]]  Fluticisone 50 mcg spr Roxane 2) Pharmacy of Choice: [facility] ([facility] [place] - [address]    Approved medications will be sent to pharmacy, we will reach out to you if there is an issue.  Requests made after 3pm may not be addressed until following business day!

## 2019-02-23 LAB — COMPREHENSIVE METABOLIC PANEL
ALT: 36 IU/L (ref 0–44)
AST: 24 IU/L (ref 0–40)
Albumin/Globulin Ratio: 1.4 (ref 1.2–2.2)
Albumin: 4.3 g/dL (ref 3.8–4.9)
Alkaline Phosphatase: 89 IU/L (ref 39–117)
BUN/Creatinine Ratio: 18 (ref 9–20)
BUN: 21 mg/dL (ref 6–24)
Bilirubin Total: 0.5 mg/dL (ref 0.0–1.2)
CO2: 22 mmol/L (ref 20–29)
Calcium: 9.4 mg/dL (ref 8.7–10.2)
Chloride: 103 mmol/L (ref 96–106)
Creatinine, Ser: 1.18 mg/dL (ref 0.76–1.27)
GFR calc Af Amer: 80 mL/min/{1.73_m2} (ref >59)
GFR calc non Af Amer: 70 mL/min/{1.73_m2} (ref >59)
Globulin, Total: 3 g/dL (ref 1.5–4.5)
Glucose: 119 mg/dL — ABNORMAL HIGH (ref 65–99)
Potassium: 4.3 mmol/L (ref 3.5–5.2)
Sodium: 139 mmol/L (ref 134–144)
Total Protein: 7.3 g/dL (ref 6.0–8.5)

## 2019-02-26 MED ORDER — SIMVASTATIN 40 MG PO TABS: 40.0000 mg | ORAL_TABLET | Freq: Every evening | ORAL | 0 refills | Status: DC

## 2019-02-26 MED ORDER — SPIRONOLACTONE 25 MG PO TABS: 25.0000 mg | ORAL_TABLET | Freq: Two times a day (BID) | ORAL | 0 refills | Status: DC

## 2019-02-26 MED ORDER — AMLODIPINE BESYLATE 10 MG PO TABS: 10.0000 mg | ORAL_TABLET | Freq: Every day | ORAL | 0 refills | Status: DC

## 2019-02-26 MED ORDER — CARVEDILOL 12.5 MG PO TABS: 12.5000 mg | ORAL_TABLET | Freq: Two times a day (BID) | ORAL | 0 refills | Status: DC

## 2019-02-26 MED ORDER — AZELASTINE HCL 0.1 % NA SOLN: 2.0000 | Freq: Two times a day (BID) | NASAL | 0 refills | Status: DC

## 2019-02-26 MED ORDER — LISINOPRIL 40 MG PO TABS: 40.0000 mg | ORAL_TABLET | Freq: Every day | ORAL | 0 refills | Status: DC

## 2019-02-26 MED ORDER — HYDROCHLOROTHIAZIDE 25 MG PO TABS: 25.0000 mg | ORAL_TABLET | Freq: Every day | ORAL | 0 refills | Status: DC

## 2019-02-26 MED ORDER — METFORMIN HCL 1000 MG PO TABS: 1000.0000 mg | ORAL_TABLET | Freq: Two times a day (BID) | ORAL | 0 refills | Status: DC

## 2019-02-26 NOTE — Progress Notes (Signed)
Normal lab letter mailed.

## 2019-03-06 ENCOUNTER — Telehealth: Payer: Self-pay | Admitting: Family Medicine

## 2019-03-06 NOTE — Telephone Encounter (Signed)
Patient calling in regards to lab results. 

## 2019-03-06 NOTE — Telephone Encounter (Signed)
Normal lab letter mailed.

## 2019-05-14 ENCOUNTER — Ambulatory Visit (INDEPENDENT_AMBULATORY_CARE_PROVIDER_SITE_OTHER): Payer: Federal, State, Local not specified - PPO | Admitting: Family Medicine

## 2019-05-14 DIAGNOSIS — N1831 Chronic kidney disease, stage 3a: Secondary | ICD-10-CM

## 2019-05-14 DIAGNOSIS — I10 Essential (primary) hypertension: Secondary | ICD-10-CM

## 2019-05-14 DIAGNOSIS — E785 Hyperlipidemia, unspecified: Secondary | ICD-10-CM

## 2019-05-14 DIAGNOSIS — E1169 Type 2 diabetes mellitus with other specified complication: Secondary | ICD-10-CM

## 2019-05-14 DIAGNOSIS — E119 Type 2 diabetes mellitus without complications: Secondary | ICD-10-CM

## 2019-05-14 DIAGNOSIS — Z79899 Other long term (current) drug therapy: Secondary | ICD-10-CM

## 2019-05-14 MED ORDER — AMLODIPINE BESYLATE 10 MG PO TABS: 10.0000 mg | ORAL_TABLET | Freq: Every day | ORAL | 0 refills | Status: DC

## 2019-05-14 MED ORDER — AZELASTINE HCL 0.1 % NA SOLN: 2.0000 | Freq: Two times a day (BID) | NASAL | 0 refills | Status: DC

## 2019-05-14 MED ORDER — CARVEDILOL 12.5 MG PO TABS: 12.5000 mg | ORAL_TABLET | Freq: Two times a day (BID) | ORAL | 0 refills | Status: DC

## 2019-05-14 MED ORDER — SIMVASTATIN 40 MG PO TABS: 40.0000 mg | ORAL_TABLET | Freq: Every evening | ORAL | 0 refills | Status: DC

## 2019-05-14 MED ORDER — METFORMIN HCL 1000 MG PO TABS: 1000.0000 mg | ORAL_TABLET | Freq: Two times a day (BID) | ORAL | 0 refills | Status: DC

## 2019-05-14 MED ORDER — HYDROCHLOROTHIAZIDE 25 MG PO TABS: 25.0000 mg | ORAL_TABLET | Freq: Every day | ORAL | 0 refills | Status: DC

## 2019-05-14 MED ORDER — LISINOPRIL 40 MG PO TABS: 40.0000 mg | ORAL_TABLET | Freq: Every day | ORAL | 0 refills | Status: DC

## 2019-05-14 NOTE — Progress Notes (Signed)
      He has not been checking FSBS regularly. Denies nausea, vomiting, numbness/tingling in his feet, polyuria, polydipsia.  Says that BP has been running in the 120s/70s at home. Denies chest pain, SHOB, dizziness, headaches, palpitations, lower extremity swelling.

## 2019-05-14 NOTE — Progress Notes (Signed)
Virtual Visit via Telephone Note  I connected with Earlene Plater on 05/14/19 at  8:30 AM EST by telephone and verified that I am speaking with the correct person using two identifiers.   I discussed the limitations, risks, security and privacy concerns of performing an evaluation and management service by telephone and the availability of in person appointments. I also discussed with the patient that there may be a patient responsible charge related to this service. The patient expressed understanding and agreed to proceed.  Patient Location: At work (out of town) Dispensing optician: [facility] Others participating in call: none   History of Present Illness:        53 year old male who reports that he needs refills of his medications for continued treatment of diabetes, hypertension and hyperlipidemia.  He is currently working out of town.  He reports that he is not checking his blood sugars on a regular basis but when he has checked his blood sugars they are generally in the 140s or less fasting.  He denies any current issues with urinary frequency, increased thirst, blurred vision and he has had no issues with numbness or tingling in his hands or feet.  When he has checked his blood pressure with his home monitor, his blood pressures have been in the 120s over 70s and he denies any headaches or dizziness related to his blood pressure.  He continues to take his cholesterol medication and denies any issues with muscle or joint pain related to his use of cholesterol medication.  He reports that he will be back in town hopefully next week.  He has however will need to find out from his employer when he can take time off of work to have his labs done in follow-up of his medical issues.  He reports that overall he feels well and he denies any issues with fever or chills, no sore throat, no difficulty swallowing, no current dental issues, no chest pain or palpitations, no shortness of breath or cough.  He  denies any abdominal pain-no nausea/vomiting/diarrhea or constipation.  No current significant muscle or joint pain and no peripheral edema/swelling.   Past Medical History:  Diagnosis Date  . BMI 40.0-44.9, adult (HCC)   . Diabetes mellitus due to underlying condition with hyperosmolarity without coma, without long-term current use of insulin (HCC)   . Essential hypertension   . Mixed hyperlipidemia   . OSA (obstructive sleep apnea)      Family History  Problem Relation Age of Onset  . Diabetes Mother   . Alzheimer's disease Father     Social History   Tobacco Use  . Smoking status: Never Smoker  . Smokeless tobacco: Never Used  Substance Use Topics  . Alcohol use: Never    Frequency: Never  . Drug use: Never     No Known Allergies     Observations/Objective: No vital signs or physical exam conducted as visit was done via telephone  Assessment and Plan: 1. Type 2 diabetes mellitus without complication, without long-term current use of insulin (HCC) Most recent hemoglobin A1c was elevated at 8.2 on 11/09/2018.  Patient is provided with refill of Metformin and has been asked to schedule lab appointment in the next few weeks to have repeat of hemoglobin A1c along with electrolytes and urine creatinine/microalbumin.  He has been asked to resume monitoring blood sugars and discussed hemoglobin A1c goal of 7 or less which averages out to a daily blood sugar of 140.  Continue with low  carbohydrate diet and incorporate daily exercise. - metFORMIN (GLUCOPHAGE) 1000 MG tablet; Take 1 tablet (1,000 mg total) by mouth 2 (two) times daily with a meal.  Dispense: 180 tablet; Refill: 0  2. Essential hypertension He reports that his home blood pressures are stable and controlled.  Prescription provided for amlodipine, carvedilol, hydrochlorothiazide and lisinopril refills.  Continue low-sodium diet and exercise. - amLODipine (NORVASC) 10 MG tablet; Take 1 tablet (10 mg total) by mouth daily.   Dispense: 90 tablet; Refill: 0 - carvedilol (COREG) 12.5 MG tablet; Take 1 tablet (12.5 mg total) by mouth 2 (two) times daily.  Dispense: 180 tablet; Refill: 0 - hydrochlorothiazide (HYDRODIURIL) 25 MG tablet; Take 1 tablet (25 mg total) by mouth daily.  Dispense: 90 tablet; Refill: 0 - lisinopril (ZESTRIL) 40 MG tablet; Take 1 tablet (40 mg total) by mouth daily.  Dispense: 90 tablet; Refill: 0  3. Hyperlipidemia associated with type 2 diabetes mellitus ([place]) Refill provided of simvastatin and patient is encouraged to continue low fat diet.  Patient will also have comprehensive metabolic panel at upcoming lab visit in follow-up of medication use. - simvastatin (ZOCOR) 40 MG tablet; Take 1 tablet (40 mg total) by mouth every evening.  Dispense: 90 tablet; Refill: 0  4.  Stage IIIa chronic kidney disease Patient with comprehensive metabolic panel done 5/80/9983 and discussed with patient that he had abnormal elevation in kidney function with creatinine of 1.62.  Patient has been asked to return to clinic to have repeat in creatinine level, CBC and urine microalbumin.  He is encouraged to remain well-hydrated and avoid the use of nonsteroidal anti-inflammatories which may worsen renal function. -CMET -CBC  5.  Long-term use of medications He has been asked to schedule lab visit in follow-up of long-term use of medications for treatment of hyperlipidemia which include statin medication which may increase liver enzymes as well as medications including lisinopril which can increase potassium and hydrochlorothiazide which can increase creatinine and BUN as well as decrease potassium levels. -CMET  Follow Up Instructions:Return in about 4 months (around 09/11/2019) for Diabetes/hypertension/lipids; 1 to 2 weeks for lab visit.    I discussed the assessment and treatment plan with the patient. The patient was provided an opportunity to ask questions and all were answered. The patient agreed with the  plan and demonstrated an understanding of the instructions.   The patient was advised to call back or seek an in-person evaluation if the symptoms worsen or if the condition fails to improve as anticipated.  I provided 13 minutes of non-face-to-face time during this encounter.   Antony Blackbird, MD

## 2019-05-15 ENCOUNTER — Encounter: Payer: Self-pay | Admitting: Family Medicine

## 2019-05-21 ENCOUNTER — Other Ambulatory Visit: Payer: Federal, State, Local not specified - PPO

## 2019-05-21 ENCOUNTER — Other Ambulatory Visit: Payer: Self-pay

## 2019-05-21 DIAGNOSIS — Z79899 Other long term (current) drug therapy: Secondary | ICD-10-CM

## 2019-05-21 DIAGNOSIS — E119 Type 2 diabetes mellitus without complications: Secondary | ICD-10-CM | POA: Diagnosis not present

## 2019-05-21 DIAGNOSIS — E1169 Type 2 diabetes mellitus with other specified complication: Secondary | ICD-10-CM | POA: Diagnosis not present

## 2019-05-21 DIAGNOSIS — I10 Essential (primary) hypertension: Secondary | ICD-10-CM

## 2019-05-21 DIAGNOSIS — E785 Hyperlipidemia, unspecified: Secondary | ICD-10-CM

## 2019-05-21 DIAGNOSIS — N1831 Chronic kidney disease, stage 3a: Secondary | ICD-10-CM | POA: Diagnosis not present

## 2019-05-21 NOTE — Progress Notes (Signed)
Patient here for labs ordered during their televisit.

## 2019-05-22 LAB — CBC
Hematocrit: 43.9 % (ref 37.5–51.0)
Hemoglobin: 15 g/dL (ref 13.0–17.7)
MCH: 31.1 pg (ref 26.6–33.0)
MCHC: 34.2 g/dL (ref 31.5–35.7)
MCV: 91 fL (ref 79–97)
Platelets: 309 x10E3/uL (ref 150–450)
RBC: 4.82 x10E6/uL (ref 4.14–5.80)
RDW: 13 % (ref 11.6–15.4)
WBC: 9 x10E3/uL (ref 3.4–10.8)

## 2019-05-22 LAB — COMPREHENSIVE METABOLIC PANEL WITH GFR
ALT: 31 IU/L (ref 0–44)
AST: 23 IU/L (ref 0–40)
Albumin/Globulin Ratio: 1.1 — ABNORMAL LOW (ref 1.2–2.2)
Albumin: 4.1 g/dL (ref 3.8–4.9)
Alkaline Phosphatase: 88 IU/L (ref 39–117)
BUN/Creatinine Ratio: 14 (ref 9–20)
BUN: 20 mg/dL (ref 6–24)
Bilirubin Total: 0.4 mg/dL (ref 0.0–1.2)
CO2: 21 mmol/L (ref 20–29)
Calcium: 9.8 mg/dL (ref 8.7–10.2)
Chloride: 103 mmol/L (ref 96–106)
Creatinine, Ser: 1.41 mg/dL — ABNORMAL HIGH (ref 0.76–1.27)
GFR calc Af Amer: 64 mL/min/1.73
GFR calc non Af Amer: 56 mL/min/1.73 — ABNORMAL LOW
Globulin, Total: 3.6 g/dL (ref 1.5–4.5)
Glucose: 97 mg/dL (ref 65–99)
Potassium: 4.4 mmol/L (ref 3.5–5.2)
Sodium: 138 mmol/L (ref 134–144)
Total Protein: 7.7 g/dL (ref 6.0–8.5)

## 2019-05-22 LAB — MICROALBUMIN / CREATININE URINE RATIO
Creatinine, Urine: 234.2 mg/dL
Microalb/Creat Ratio: 3 mg/g{creat} (ref 0–29)
Microalbumin, Urine: 7 ug/mL

## 2019-05-22 LAB — HEMOGLOBIN A1C
Est. average glucose Bld gHb Est-mCnc: 157 mg/dL
Hgb A1c MFr Bld: 7.1 % — ABNORMAL HIGH (ref 4.8–5.6)

## 2019-05-25 NOTE — Progress Notes (Signed)
Patient notified of results & recommendations. Expressed understanding.

## 2019-08-17 ENCOUNTER — Telehealth: Payer: Self-pay

## 2019-08-17 NOTE — Telephone Encounter (Signed)
Called patient to do their pre-visit COVID screening.  Call went to voicemail, which is full. Unable to do prescreening.  

## 2019-08-20 ENCOUNTER — Ambulatory Visit (INDEPENDENT_AMBULATORY_CARE_PROVIDER_SITE_OTHER): Payer: Federal, State, Local not specified - PPO | Admitting: Internal Medicine

## 2019-08-20 ENCOUNTER — Other Ambulatory Visit: Payer: Self-pay

## 2019-08-20 ENCOUNTER — Encounter: Payer: Self-pay | Admitting: Internal Medicine

## 2019-08-20 VITALS — BP 114/75 | HR 82 | Temp 97.5°F | Resp 17 | Wt 276.0 lb

## 2019-08-20 DIAGNOSIS — E1169 Type 2 diabetes mellitus with other specified complication: Secondary | ICD-10-CM

## 2019-08-20 DIAGNOSIS — E785 Hyperlipidemia, unspecified: Secondary | ICD-10-CM

## 2019-08-20 DIAGNOSIS — R7989 Other specified abnormal findings of blood chemistry: Secondary | ICD-10-CM

## 2019-08-20 DIAGNOSIS — E119 Type 2 diabetes mellitus without complications: Secondary | ICD-10-CM

## 2019-08-20 DIAGNOSIS — I10 Essential (primary) hypertension: Secondary | ICD-10-CM | POA: Diagnosis not present

## 2019-08-20 LAB — POCT GLYCOSYLATED HEMOGLOBIN (HGB A1C): Hemoglobin A1C: 6.9 % — AB (ref 4.0–5.6)

## 2019-08-20 LAB — GLUCOSE, POCT (MANUAL RESULT ENTRY): POC Glucose: 142 mg/dl — AB (ref 70–99)

## 2019-08-20 MED ORDER — CARVEDILOL 12.5 MG PO TABS: 12.5000 mg | ORAL_TABLET | Freq: Two times a day (BID) | ORAL | 1 refills | Status: DC

## 2019-08-20 MED ORDER — METFORMIN HCL 1000 MG PO TABS: 1000.0000 mg | ORAL_TABLET | Freq: Two times a day (BID) | ORAL | 1 refills | Status: DC

## 2019-08-20 MED ORDER — SIMVASTATIN 40 MG PO TABS: 40.0000 mg | ORAL_TABLET | Freq: Every evening | ORAL | 1 refills | Status: DC

## 2019-08-20 MED ORDER — LISINOPRIL 40 MG PO TABS: 40.0000 mg | ORAL_TABLET | Freq: Every day | ORAL | 1 refills | Status: DC

## 2019-08-20 MED ORDER — HYDROCHLOROTHIAZIDE 25 MG PO TABS: 25.0000 mg | ORAL_TABLET | Freq: Every day | ORAL | 1 refills | Status: DC

## 2019-08-20 MED ORDER — AMLODIPINE BESYLATE 10 MG PO TABS: 10.0000 mg | ORAL_TABLET | Freq: Every day | ORAL | 1 refills | Status: DC

## 2019-08-20 MED ORDER — SPIRONOLACTONE 25 MG PO TABS: 25.0000 mg | ORAL_TABLET | Freq: Two times a day (BID) | ORAL | 1 refills | Status: DC

## 2019-08-20 NOTE — Patient Instructions (Signed)
Thank you for choosing Primary Care at [facility] to be your medical home!    Thomas Cummings was seen by De Hollingshead, DO today.   Thomas Cummings primary care provider is Thomas Siren, DO.   For the best care possible, you should try to see Thomas Siren, DO whenever you come to the clinic.   We look forward to seeing you again soon!  If you have any questions about your visit today, please call us at [phone] or feel free to reach your primary care provider via MyChart.

## 2019-08-20 NOTE — Progress Notes (Signed)
Subjective:    Thomas Cummings - 54 y.o. male MRN [id number]  Date of birth: 1966-04-23  HPI  Thomas Cummings is here for f/u of chronic medical conditions.  Chronic HTN Disease Monitoring:  Home BP Monitoring - Monitors. Typically 102-128/80s.  Chest pain- no  Dyspnea- no Headache - no  Medications: Lisinopril 40 mg, Spironolactone 25 mg BID, Coreg 12.5 mg BID, HCTZ 25 mg daily  Compliance- yes Lightheadedness- no  Edema- no    Diabetes mellitus, Type 2 Disease Monitoring             Blood Sugar Ranges: Fasting - Does not check at home.              Polyuria: No              Visual problems: no   Urine Microalbumin 3 (Dec 2020), On Ace inhibitor   Last A1C: 7.1 (Dec 2020)   Medications: Metformin 1000 mg BID  Medication Compliance: yes  Medication Side Effects             Hypoglycemia: no   Preventitive Health Care             Eye Exam: Needs              Foot Exam: Needs       Health Maintenance:  Health Maintenance Due  Topic Date Due  . OPHTHALMOLOGY EXAM  04/20/1974    -  reports that he has never smoked. He has never used smokeless tobacco. - Review of Systems: Per HPI. - Past Medical History: Patient Active Problem List   Diagnosis Date Noted  . Type 2 diabetes mellitus without complication, without long-term current use of insulin ([place]) 04/08/2018  . Essential hypertension 04/08/2018  . BMI 39.0-39.9,adult 04/08/2018  . Testosterone deficiency in male 04/08/2018   - Medications: reviewed and updated   Objective:   Physical Exam BP 114/75   Pulse 82   Temp (!) 97.5 F (36.4 C) (Temporal)   Resp 17   Wt 276 lb (125.2 kg)   SpO2 97%   BMI 38.49 kg/m  Physical Exam  Constitutional: He is oriented to person, place, and time and well-developed, well-nourished, and in no distress. No distress.  HENT:  Head: Normocephalic and atraumatic.  Eyes: Conjunctivae and EOM are normal.  Cardiovascular: Normal rate, regular rhythm and  normal heart sounds.  No murmur heard. Pulmonary/Chest: Effort normal and breath sounds normal. No respiratory distress.  Musculoskeletal:        General: Normal range of motion.     Comments: Diabetic Foot Check -  Appearance - no lesions, ulcers or calluses Skin - no unusual pallor or redness Monofilament testing - normal bilaterally  Right - Great toe, medial, central, lateral ball and posterior foot intact Left - Great toe, medial, central, lateral ball and posterior foot intact   Neurological: He is alert and oriented to person, place, and time.  Skin: Skin is warm and dry. He is not diaphoretic.  Psychiatric: Affect and judgment normal.           Assessment & Plan:   1. Type 2 diabetes mellitus without complication, without long-term current use of insulin (HCC) CBG 142 and A1c 6.9. Patient is at goal. Continue metformin therapy. Can f/u in 6 months.  Counseled on Diabetic diet, my plate method, 160 minutes of moderate intensity exercise/week Blood sugar logs with fasting goals of 80-120 mg/dl, random of less than 180 and in the event of  sugars less than 60 mg/dl or greater than 726 mg/dl encouraged to notify the clinic. Advised on the need for annual eye exams, annual foot exams, Pneumonia vaccine. - Glucose (CBG) - HgB A1c - Ambulatory referral to Ophthalmology - HM DIABETES FOOT EXAM - metFORMIN (GLUCOPHAGE) 1000 MG tablet; Take 1 tablet (1,000 mg total) by mouth 2 (two) times daily with a meal.  Dispense: 180 tablet; Refill: 1  2. Essential hypertension BP at goal today. Continue current regimen.  Counseled on blood pressure goal of less than 130/80, low-sodium, DASH diet, medication compliance, 150 minutes of moderate intensity exercise per week. Discussed medication compliance, adverse effects. - Comprehensive metabolic panel - amLODipine (NORVASC) 10 MG tablet; Take 1 tablet (10 mg total) by mouth daily.  Dispense: 90 tablet; Refill: 1 - carvedilol (COREG) 12.5 MG  tablet; Take 1 tablet (12.5 mg total) by mouth 2 (two) times daily.  Dispense: 180 tablet; Refill: 1 - hydrochlorothiazide (HYDRODIURIL) 25 MG tablet; Take 1 tablet (25 mg total) by mouth daily.  Dispense: 90 tablet; Refill: 1 - lisinopril (ZESTRIL) 40 MG tablet; Take 1 tablet (40 mg total) by mouth daily.  Dispense: 90 tablet; Refill: 1 - spironolactone (ALDACTONE) 25 MG tablet; Take 1 tablet (25 mg total) by mouth 2 (two) times daily.  Dispense: 180 tablet; Refill: 1  3. Elevated serum creatinine Cr 1.41 with preserved GFR. Has been elevated in past but was normal at prior lab draw in Sept. Patient reports drinking significantly more soda than water. Discussed need for adequate hydration. If more significantly elevated, could likely decrease dose of Lisinopril as no evidence of microalbuminuria recently and BP at goal.   - Comprehensive metabolic panel  4. Hyperlipidemia associated with type 2 diabetes mellitus (HCC) Last lipid panel in May 2020 with Cholesterol of 123 and LDL of 52.  - simvastatin (ZOCOR) 40 MG tablet; Take 1 tablet (40 mg total) by mouth every evening.  Dispense: 90 tablet; Refill: 1     Marcy Siren, D.O. 08/20/2019, 8:55 AM Primary Care at [facility]

## 2019-08-21 LAB — COMPREHENSIVE METABOLIC PANEL
ALT: 36 IU/L (ref 0–44)
AST: 25 IU/L (ref 0–40)
Albumin/Globulin Ratio: 1.2 (ref 1.2–2.2)
Albumin: 4.1 g/dL (ref 3.8–4.9)
Alkaline Phosphatase: 84 IU/L (ref 39–117)
BUN/Creatinine Ratio: 15 (ref 9–20)
BUN: 18 mg/dL (ref 6–24)
Bilirubin Total: 0.3 mg/dL (ref 0.0–1.2)
CO2: 25 mmol/L (ref 20–29)
Calcium: 9.8 mg/dL (ref 8.7–10.2)
Chloride: 102 mmol/L (ref 96–106)
Creatinine, Ser: 1.17 mg/dL (ref 0.76–1.27)
GFR calc Af Amer: 81 mL/min/{1.73_m2} (ref >59)
GFR calc non Af Amer: 70 mL/min/{1.73_m2} (ref >59)
Globulin, Total: 3.4 g/dL (ref 1.5–4.5)
Glucose: 133 mg/dL — ABNORMAL HIGH (ref 65–99)
Potassium: 4.6 mmol/L (ref 3.5–5.2)
Sodium: 139 mmol/L (ref 134–144)
Total Protein: 7.5 g/dL (ref 6.0–8.5)

## 2019-08-23 NOTE — Progress Notes (Signed)
Normal lab letter mailed.

## 2019-09-29 ENCOUNTER — Encounter: Payer: Self-pay | Admitting: *Deleted

## 2019-09-29 ENCOUNTER — Other Ambulatory Visit: Payer: Self-pay

## 2019-09-29 ENCOUNTER — Ambulatory Visit
Admission: EM | Admit: 2019-09-29 | Discharge: 2019-09-29 | Disposition: A | Discharge status: Home / Self Care | Payer: Federal, State, Local not specified - PPO | Attending: Physician Assistant | Admitting: Physician Assistant

## 2019-09-29 DIAGNOSIS — M25512 Pain in left shoulder: Secondary | ICD-10-CM

## 2019-09-29 DIAGNOSIS — W19XXXA Unspecified fall, initial encounter: Secondary | ICD-10-CM

## 2019-09-29 DIAGNOSIS — Z026 Encounter for examination for insurance purposes: Secondary | ICD-10-CM

## 2019-09-29 MED ORDER — DICLOFENAC SODIUM 75 MG PO TBEC: 75.0000 mg | DELAYED_RELEASE_TABLET | Freq: Two times a day (BID) | ORAL | 0 refills | Status: DC

## 2019-09-29 MED ORDER — TIZANIDINE HCL 2 MG PO TABS: 2.0000 mg | ORAL_TABLET | Freq: Three times a day (TID) | ORAL | 0 refills | Status: DC | PRN

## 2019-09-29 NOTE — ED Provider Notes (Signed)
[facility]    CSN: [id number] Arrival date & time: 09/29/19  0851      History   Chief Complaint Chief Complaint  Patient presents with  . Shoulder Injury    HPI Thomas Cummings is a 54 y.o. male.   54 year old male comes in for left shoulder pain after falling at work yesterday.  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  States was trying to catch his fall with his left arm outstretched, but shoulder went to "away and should not have went".  Unsure exactly how he landed, but denies direct impact to the shoulder.  Denies head injury, loss of consciousness.  States shoulder was sore after fall, but manageable.  Pain worsened throughout the night, and therefore came in for evaluation.  Denies radiation of pain, loss of grip strength, numbness, tingling.  Took Tylenol without relief.     Past Medical History:  Diagnosis Date  . BMI 40.0-44.9, adult (HCC)   . Diabetes mellitus due to underlying condition with hyperosmolarity without coma, without long-term current use of insulin (HCC)   . Essential hypertension   . Mixed hyperlipidemia   . OSA (obstructive sleep apnea)     Patient Active Problem List   Diagnosis Date Noted  . Type 2 diabetes mellitus without complication, without long-term current use of insulin (HCC) 04/08/2018  . Essential hypertension 04/08/2018  . BMI 39.0-39.9,adult 04/08/2018  . Testosterone deficiency in male 04/08/2018    Past Surgical History:  Procedure Laterality Date  . NO PAST SURGERIES         Home Medications    Prior to Admission medications   Medication Sig Start Date End Date Taking? Authorizing Provider  amLODipine (NORVASC) 10 MG tablet Take 1 tablet (10 mg total) by mouth daily. 08/20/19  Yes Arvilla Market, DO  carvedilol (COREG) 12.5 MG tablet Take 1 tablet (12.5 mg total) by mouth 2 (two) times daily. 08/20/19  Yes Arvilla Market, DO  hydrochlorothiazide  (HYDRODIURIL) 25 MG tablet Take 1 tablet (25 mg total) by mouth daily. 08/20/19  Yes Arvilla Market, DO  lisinopril (ZESTRIL) 40 MG tablet Take 1 tablet (40 mg total) by mouth daily. 08/20/19  Yes Arvilla Market, DO  metFORMIN (GLUCOPHAGE) 1000 MG tablet Take 1 tablet (1,000 mg total) by mouth 2 (two) times daily with a meal. 08/20/19  Yes Arvilla Market, DO  simvastatin (ZOCOR) 40 MG tablet Take 1 tablet (40 mg total) by mouth every evening. 08/20/19  Yes Arvilla Market, DO  spironolactone (ALDACTONE) 25 MG tablet Take 1 tablet (25 mg total) by mouth 2 (two) times daily. 08/20/19  Yes Arvilla Market, DO  diclofenac (VOLTAREN) 75 MG EC tablet Take 1 tablet (75 mg total) by mouth 2 (two) times daily. 09/29/19   Cathie Hoops, Jaretssi Kraker V, PA-C  tiZANidine (ZANAFLEX) 2 MG tablet Take 1 tablet (2 mg total) by mouth every 8 (eight) hours as needed for muscle spasms. 09/29/19   Belinda Fisher, PA-C    Family History Family History  Problem Relation Age of Onset  . Diabetes Mother   . Alzheimer's disease Father     Social History Social History   Tobacco Use  . Smoking status: Never Smoker  . Smokeless tobacco: Never Used  Substance Use Topics  . Alcohol use: Not Currently  . Drug use: Never     Allergies   Patient has no known allergies.   Review  of Systems Review of Systems  Reason unable to perform ROS: See HPI as above.     Physical Exam Triage Vital Signs ED Triage Vitals  Enc Vitals Group     BP 09/29/19 0903 (!) 155/103     Pulse Rate 09/29/19 0903 79     Resp 09/29/19 0903 16     Temp 09/29/19 0903 98.5 F (36.9 C)     Temp Source 09/29/19 0903 Oral     SpO2 09/29/19 0903 96 %     Weight --      Height --      Head Circumference --      Peak Flow --      Pain Score 09/29/19 0906 5     Pain Loc --      Pain Edu? --      Excl. in [place]? --    No data found.  Updated Vital Signs BP (!) 155/103 (BP Location: Right Arm)   Pulse 79   Temp  98.5 F (36.9 C) (Oral)   Resp 16   SpO2 96%   Physical Exam Constitutional:      General: He is not in acute distress.    Appearance: He is well-developed. He is not diaphoretic.  HENT:     Head: Normocephalic and atraumatic.  Eyes:     Conjunctiva/sclera: Conjunctivae normal.     Pupils: Pupils are equal, round, and reactive to light.  Cardiovascular:     Rate and Rhythm: Normal rate and regular rhythm.     Heart sounds: Normal heart sounds. No murmur. No friction rub. No gallop.   Pulmonary:     Effort: Pulmonary effort is normal. No accessory muscle usage or respiratory distress.     Breath sounds: Normal breath sounds. No stridor. No decreased breath sounds, wheezing, rhonchi or rales.  Abdominal:     Comments: Negative seatbelt sign  Musculoskeletal:     Cervical back: Normal range of motion and neck supple. No pain with movement, spinous process tenderness or muscular tenderness.     Comments: No tenderness to palpation of the spinous processes. Tenderness to palpation of posterior shoulder, deltoid.  Mild tenderness to anterior shoulder along bicep tendon attachment.  No tenderness to palpation along clavicle.  No tenderness to palpation of thoracic back.  Decreased abduction. Strength 5/5. Sensation intact and equal bilaterally. Radial pulse 2+  Skin:    General: Skin is warm and dry.  Neurological:     Mental Status: He is alert and oriented to person, place, and time. He is not disoriented.     GCS: GCS eye subscore is 4. GCS verbal subscore is 5. GCS motor subscore is 6.     Coordination: Coordination normal.     Gait: Gait normal.      UC Treatments / Results  Labs (all labs ordered are listed, but only abnormal results are displayed) Labs Reviewed - No data to display  EKG   Radiology No results found.  Procedures Procedures (including critical care time)  Medications Ordered in UC Medications - No data to display  Initial Impression / Assessment and  Plan / UC Course  I have reviewed the triage vital signs and the nursing notes.  Pertinent labs & imaging results that were available during my care of the patient were reviewed by me and considered in my medical decision making (see chart for details).    History and exam with low suspicion of fracture, no need for x-ray at this  time.  Will start diclofenac, tizanidine for symptomatic management.  Shoulder range of motion exercises discussed.  Discussed expected course of healing.  Will follow up with occupational health for further evaluation and paperwork needed.  Return precautions given.  Final Clinical Impressions(s) / UC Diagnoses   Final diagnoses:  Acute pain of left shoulder  Fall, initial encounter  Encounter related to worker's compensation claim   ED Prescriptions    Medication Sig Dispense Auth. Provider   diclofenac (VOLTAREN) 75 MG EC tablet Take 1 tablet (75 mg total) by mouth 2 (two) times daily. 20 tablet Jariel Drost V, PA-C   tiZANidine (ZANAFLEX) 2 MG tablet Take 1 tablet (2 mg total) by mouth every 8 (eight) hours as needed for muscle spasms. 15 tablet Belinda Fisher, PA-C     PDMP not reviewed this encounter.   Belinda Fisher, PA-C 09/29/19 1003

## 2019-09-29 NOTE — ED Triage Notes (Signed)
Pt reports slipping at falling at work yesterday, causing left shoulder pain.  States "when trying to catch myself, my shoulder went a way it shouldn't have went".  C/O left posterior shoulder pain.  LUE CMS intact.  Has taken Tyl.  Denies any head, neck or back injury.

## 2019-09-29 NOTE — Discharge Instructions (Signed)
Start diclofenac. Do not take ibuprofen (motrin/advil)/ naproxen (aleve) while on diclofenac. tizanidine as needed, this can make you drowsy, so do not take if you are going to drive, operate heavy machinery, or make important decisions. Ice/heat compresses as needed. Shoulder rotation daily. This can take up to 3-4 weeks to completely resolve, but you should be feeling better each week. Follow up with occupational health Monday for paperwork and reevaluation. If with shoulder dislocation, loss of grip strength, go to the emergency department for further evaluation.

## 2020-02-19 ENCOUNTER — Other Ambulatory Visit (INDEPENDENT_AMBULATORY_CARE_PROVIDER_SITE_OTHER): Payer: Federal, State, Local not specified - PPO

## 2020-02-19 ENCOUNTER — Encounter: Payer: Self-pay | Admitting: Internal Medicine

## 2020-02-19 ENCOUNTER — Other Ambulatory Visit: Payer: Self-pay

## 2020-02-19 ENCOUNTER — Telehealth (INDEPENDENT_AMBULATORY_CARE_PROVIDER_SITE_OTHER): Payer: Federal, State, Local not specified - PPO | Admitting: Internal Medicine

## 2020-02-19 DIAGNOSIS — E119 Type 2 diabetes mellitus without complications: Secondary | ICD-10-CM | POA: Diagnosis not present

## 2020-02-19 DIAGNOSIS — M25562 Pain in left knee: Secondary | ICD-10-CM

## 2020-02-19 DIAGNOSIS — I10 Essential (primary) hypertension: Secondary | ICD-10-CM | POA: Diagnosis not present

## 2020-02-19 DIAGNOSIS — E785 Hyperlipidemia, unspecified: Secondary | ICD-10-CM

## 2020-02-19 DIAGNOSIS — E1169 Type 2 diabetes mellitus with other specified complication: Secondary | ICD-10-CM | POA: Diagnosis not present

## 2020-02-19 DIAGNOSIS — Z79899 Other long term (current) drug therapy: Secondary | ICD-10-CM

## 2020-02-19 DIAGNOSIS — M25462 Effusion, left knee: Secondary | ICD-10-CM

## 2020-02-19 MED ORDER — METFORMIN HCL 1000 MG PO TABS: 1000.0000 mg | ORAL_TABLET | Freq: Two times a day (BID) | ORAL | 1 refills | Status: DC

## 2020-02-19 MED ORDER — AMLODIPINE BESYLATE 10 MG PO TABS: 10.0000 mg | ORAL_TABLET | Freq: Every day | ORAL | 1 refills | Status: DC

## 2020-02-19 MED ORDER — HYDROCHLOROTHIAZIDE 25 MG PO TABS: 25.0000 mg | ORAL_TABLET | Freq: Every day | ORAL | 1 refills | Status: DC

## 2020-02-19 MED ORDER — LISINOPRIL 40 MG PO TABS: 40.0000 mg | ORAL_TABLET | Freq: Every day | ORAL | 1 refills | Status: DC

## 2020-02-19 MED ORDER — CARVEDILOL 12.5 MG PO TABS: 12.5000 mg | ORAL_TABLET | Freq: Two times a day (BID) | ORAL | 1 refills | Status: DC

## 2020-02-19 MED ORDER — SIMVASTATIN 40 MG PO TABS: 40.0000 mg | ORAL_TABLET | Freq: Every evening | ORAL | 1 refills | Status: DC

## 2020-02-19 MED ORDER — SPIRONOLACTONE 25 MG PO TABS: 25.0000 mg | ORAL_TABLET | Freq: Two times a day (BID) | ORAL | 1 refills | Status: DC

## 2020-02-19 NOTE — Progress Notes (Signed)
Virtual Visit via Telephone Note  I connected with Thomas Cummings, on 02/19/2020 at 8:46 AM by telephone due to the COVID-19 pandemic and verified that I am speaking with the correct person using two identifiers.   Consent: I discussed the limitations, risks, security and privacy concerns of performing an evaluation and management service by telephone and the availability of in person appointments. I also discussed with the patient that there may be a patient responsible charge related to this service. The patient expressed understanding and agreed to proceed.   Location of Patient: Home   Location of Provider: Clinic    Persons participating in Telemedicine visit: Zakariye Nee [facility] Dr. Earlene Cummings      History of Present Illness: Patient has a visit to f/u on chronic medical conditions.   Chronic HTN Disease Monitoring:  Home BP Monitoring - 118/77 this AM. Reports this is typical and sometimes he actually has lower numbers.    Chest pain- no  Dyspnea- no Headache - no  Medications: Amlodipine 10 mg, Coreg 12.5 mg BID, HCTZ 25 mg, Lisinopril 40 mg, Spironolactone 25 mg Compliance- yes Lightheadedness- no  Edema- no   Diabetes mellitus, Type 2 Disease Monitoring             Blood Sugar Ranges: Does not monitor              Polyuria: no             Visual problems: no   Urine Microalbumin 3 (Dec 2020)--on Ace Inhibitor   Last A1C: 6.9 (March 2021)   Medications: Metformin 1000 mg BID  Medication Compliance: yes  Medication Side Effects             Hypoglycemia: no     Knee Pain: Left sided. Started about a couple months ago after bumping it. Reports pain has improved but feels a popping sensation especially when he goes down the stairs. Reports intermittent swelling. Did try a copper band on his knee but found it irritated him more than it helped.     Past Medical History:  Diagnosis Date  . BMI 40.0-44.9, adult (HCC)   . Diabetes  mellitus due to underlying condition with hyperosmolarity without coma, without long-term current use of insulin (HCC)   . Essential hypertension   . Mixed hyperlipidemia   . OSA (obstructive sleep apnea)    No Known Allergies  Current Outpatient Medications on File Prior to Visit  Medication Sig Dispense Refill  . amLODipine (NORVASC) 10 MG tablet Take 1 tablet (10 mg total) by mouth daily. 90 tablet 1  . carvedilol (COREG) 12.5 MG tablet Take 1 tablet (12.5 mg total) by mouth 2 (two) times daily. 180 tablet 1  . hydrochlorothiazide (HYDRODIURIL) 25 MG tablet Take 1 tablet (25 mg total) by mouth daily. 90 tablet 1  . lisinopril (ZESTRIL) 40 MG tablet Take 1 tablet (40 mg total) by mouth daily. 90 tablet 1  . metFORMIN (GLUCOPHAGE) 1000 MG tablet Take 1 tablet (1,000 mg total) by mouth 2 (two) times daily with a meal. 180 tablet 1  . simvastatin (ZOCOR) 40 MG tablet Take 1 tablet (40 mg total) by mouth every evening. 90 tablet 1  . spironolactone (ALDACTONE) 25 MG tablet Take 1 tablet (25 mg total) by mouth 2 (two) times daily. 180 tablet 1   No current facility-administered medications on file prior to visit.    Observations/Objective: NAD. Speaking clearly.  Work of breathing normal.  Alert and oriented. Mood appropriate.  Assessment and Plan: 1. Type 2 diabetes mellitus without complication, without long-term current use of insulin (HCC) Last A1c showed good glycemic control. Reports compliance with Metformin. Unsure of current blood sugar trend as patient does not monitor at home. Repeat A1c.  Counseled on Diabetic diet, my plate method, 657 minutes of moderate intensity exercise/week Blood sugar logs with fasting goals of 80-120 mg/dl, random of less than 846 and in the event of sugars less than 60 mg/dl or greater than 962 mg/dl encouraged to notify the clinic. Advised on the need for annual eye exams, annual foot exams, Pneumonia vaccine. - Hemoglobin A1c; Future - Lipid  Panel; Future - Ambulatory referral to Ophthalmology - metFORMIN (GLUCOPHAGE) 1000 MG tablet; Take 1 tablet (1,000 mg total) by mouth 2 (two) times daily with a meal.  Dispense: 180 tablet; Refill: 1  2. Essential hypertension BP seems to be at goal per patient home readings. Asymptomatic. Continue current regimen. Given use of Ace inhibitor, thiazide diuretic, and K sparing diuretic monitor BMET.  Counseled on blood pressure goal of less than 130/80, low-sodium, DASH diet, medication compliance, 150 minutes of moderate intensity exercise per week. Discussed medication compliance, adverse effects. - Lipid Panel; Future - amLODipine (NORVASC) 10 MG tablet; Take 1 tablet (10 mg total) by mouth daily.  Dispense: 90 tablet; Refill: 1 - carvedilol (COREG) 12.5 MG tablet; Take 1 tablet (12.5 mg total) by mouth 2 (two) times daily.  Dispense: 180 tablet; Refill: 1 - hydrochlorothiazide (HYDRODIURIL) 25 MG tablet; Take 1 tablet (25 mg total) by mouth daily.  Dispense: 90 tablet; Refill: 1 - lisinopril (ZESTRIL) 40 MG tablet; Take 1 tablet (40 mg total) by mouth daily.  Dispense: 90 tablet; Refill: 1 - spironolactone (ALDACTONE) 25 MG tablet; Take 1 tablet (25 mg total) by mouth 2 (two) times daily.  Dispense: 180 tablet; Refill: 1 - Basic Metabolic Panel  3. Hyperlipidemia associated with type 2 diabetes mellitus (HCC) - Lipid Panel; Future - simvastatin (ZOCOR) 40 MG tablet; Take 1 tablet (40 mg total) by mouth every evening.  Dispense: 90 tablet; Refill: 1  4. Pain and swelling of left knee - Ambulatory referral to Sports Medicine  5. Medication management - Basic Metabolic Panel   Follow Up Instructions: 3-6 month f/u for chronic medical conditions pending results    I discussed the assessment and treatment plan with the patient. The patient was provided an opportunity to ask questions and all were answered. The patient agreed with the plan and demonstrated an understanding of the  instructions.   The patient was advised to call back or seek an in-person evaluation if the symptoms worsen or if the condition fails to improve as anticipated.     I provided 24 minutes total of non-face-to-face time during this encounter including median intraservice time, reviewing previous notes, investigations, ordering medications, medical decision making, coordinating care and patient verbalized understanding at the end of the visit.    Marcy Siren, D.O. Primary Care at [facility]  02/19/2020, 8:46 AM

## 2020-02-20 ENCOUNTER — Ambulatory Visit: Payer: Federal, State, Local not specified - PPO | Admitting: Internal Medicine

## 2020-02-20 ENCOUNTER — Other Ambulatory Visit: Payer: Self-pay | Admitting: Internal Medicine

## 2020-02-20 LAB — HEMOGLOBIN A1C
Est. average glucose Bld gHb Est-mCnc: 169 mg/dL
Hgb A1c MFr Bld: 7.5 % — ABNORMAL HIGH (ref 4.8–5.6)

## 2020-02-20 LAB — LIPID PANEL
Chol/HDL Ratio: 3.3 ratio (ref 0.0–5.0)
Cholesterol, Total: 143 mg/dL (ref 100–199)
HDL: 44 mg/dL (ref >39)
LDL Chol Calc (NIH): 84 mg/dL (ref 0–99)
Triglycerides: 78 mg/dL (ref 0–149)
VLDL Cholesterol Cal: 15 mg/dL (ref 5–40)

## 2020-02-20 MED ORDER — ATORVASTATIN CALCIUM 40 MG PO TABS: 40.0000 mg | ORAL_TABLET | Freq: Every day | ORAL | 3 refills | Status: DC

## 2020-03-03 ENCOUNTER — Telehealth: Payer: Self-pay | Admitting: Internal Medicine

## 2020-03-03 NOTE — Telephone Encounter (Signed)
Lab letter was mailed.

## 2020-03-03 NOTE — Telephone Encounter (Signed)
° ° °  Pt called about lab results and would like a call back  °

## 2020-03-11 ENCOUNTER — Telehealth: Payer: Self-pay | Admitting: Physical Medicine and Rehabilitation

## 2020-03-11 NOTE — Telephone Encounter (Signed)
Documented in referral  

## 2020-03-11 NOTE — Telephone Encounter (Signed)
Pt would like a CB to set an appt; he states with his job it's hard for him to answer so if a message could be left that's fine; but he will be free for the next 10 mins.   [phone]

## 2020-03-26 ENCOUNTER — Ambulatory Visit: Payer: Federal, State, Local not specified - PPO | Admitting: Physical Medicine and Rehabilitation

## 2020-03-26 ENCOUNTER — Encounter: Payer: Self-pay | Admitting: Physical Medicine and Rehabilitation

## 2020-03-26 ENCOUNTER — Ambulatory Visit: Payer: Self-pay

## 2020-03-26 ENCOUNTER — Other Ambulatory Visit: Payer: Self-pay

## 2020-03-26 VITALS — BP 137/89 | HR 78

## 2020-03-26 DIAGNOSIS — E119 Type 2 diabetes mellitus without complications: Secondary | ICD-10-CM

## 2020-03-26 DIAGNOSIS — G8929 Other chronic pain: Secondary | ICD-10-CM | POA: Diagnosis not present

## 2020-03-26 DIAGNOSIS — M1712 Unilateral primary osteoarthritis, left knee: Secondary | ICD-10-CM

## 2020-03-26 DIAGNOSIS — M25562 Pain in left knee: Secondary | ICD-10-CM

## 2020-03-26 NOTE — Progress Notes (Signed)
Thomas Cummings - 54 y.o. male MRN [id number]  Date of birth: [birth year]  Office Visit Note: Visit Date: 03/26/2020 PCP: Arvilla Market, DO Referred by: Leary Roca*  Subjective: Chief Complaint  Patient presents with  . Left Knee - Pain   HPI: Thomas Cummings is a 54 y.o. male who comes in today For new patient evaluation and management as requested by his primary care physician Dr. Earlene Plater for worsening severe left knee pain.  Patient is present today with his spouse who provides some of the history.  He reports worsening left knee pain with intermittent swelling and some days with really difficult weightbearing because of knee pain.  He points to the anterior medial part of the knee and really overall the knee.  He denies specific injury but he says he at times will have to go up and down ladders at work and he felt like in June he had a small bump with a small bruise but no twisting injury or significant trauma that he is aware of.  No real history of severe knee pain in the past although he has had joint pain at times.  He denies any numbness tingling or paresthesia or focal weakness.  No real prior treatment of his knees.  He has tried Voltaren gel which seemed of helped some as well as Tylenol.  He denies any prior injury to the knee.  He does endorse some clicking sound at times particularly going down a ladder or steps.  He has not noted any locking or instability.  He does feel like he has trouble flexing the knee fully it feels like it is swollen at times.  He has had no prior imaging.  X-rays obtained today do show medial compartment arthritis of the left knee.  Patient's case is complicated by type 2 diabetes with last hemoglobin A1c 7.5 with some history of hypertension and dyslipidemia.  Although a tall individual he is over 250 pounds.  Review of Systems  Musculoskeletal: Positive for joint pain.  All other systems reviewed and are negative.  Otherwise per  HPI.  Assessment & Plan: Visit Diagnoses:  1. Chronic pain of left knee   2. Unilateral primary osteoarthritis, left knee   3. Type 2 diabetes mellitus without complication, without long-term current use of insulin (HCC)     Plan: Findings:  Chronic left knee pain over the last 3 months with intermittent worsening without clinical feeling of instability or specific injury.  Today clinical exam consistent with left knee osteoarthritis with some concern possibly for cartilage or meniscal pathology.  Exam does not show any instability of the ligaments and really no joint line tenderness.  We discussed x-rays with him and plan.  Today we completed intra-articular injection of his left knee using fluoroscopic guidance Duda body habitus.  As noted in the report of that he did get really good relief during the anesthetic phase.  He will monitor the results of the cortisone part of this over the next several days.  He can continue to wear knee sleeve if he would like.  He can continue to use the Voltaren gel.  He is not really a good candidate for any long-term anti-inflammatory orally.  Would consider physical therapy referral although he is very active and does stay strong.  If the relief is very short-lived I would have him see Dr. Burnard Bunting in the office for more thorough evaluation of the left knee from an orthopedic standpoint.  In terms  of activity modification he is really only limited at this point from a symptomatic standpoint and we talked about that at great length.    Meds & Orders: No orders of the defined types were placed in this encounter.   Orders Placed This Encounter  Procedures  . Large Joint Inj: L knee  . XR Knee 1-2 Views Left  . XR C-ARM NO REPORT    Follow-up: Return if symptoms worsen or fail to improve.   Procedures: Large Joint Inj: L knee on 03/26/2020 9:26 AM Indications: pain and diagnostic evaluation Details: 22 G 3.5 in needle, fluoroscopy-guided anterolateral  approach  Arthrogram: No  Medications: 40 mg triamcinolone acetonide 40 MG/ML; 4 mL bupivacaine 0.25 % Outcome: tolerated well, no immediate complications  There was excellent flow of contrast producing a partial arthrogram of the knee. The patient did have relief of symptoms during the anesthetic phase of the injection.  Consent was given by the patient. Immediately prior to procedure a time out was called to verify the correct patient, procedure, equipment, support staff and site/side marked as required. Patient was prepped and draped in the usual sterile fashion.      No notes on file   Clinical History: No specialty comments available.   He reports that he has never smoked. He has never used smokeless tobacco.  Recent Labs    05/21/19 1555 08/20/19 0855 02/19/20 0922  HGBA1C 7.1* 6.9* 7.5*    Objective:  VS:  HT:    WT:   BMI:     BP:137/89  HR:78bpm  TEMP: ( )  RESP:  Physical Exam Vitals and nursing note reviewed.  Constitutional:      General: He is not in acute distress.    Appearance: Normal appearance. He is well-developed.  HENT:     Head: Normocephalic and atraumatic.  Eyes:     Conjunctiva/sclera: Conjunctivae normal.     Pupils: Pupils are equal, round, and reactive to light.  Cardiovascular:     Rate and Rhythm: Normal rate.     Pulses: Normal pulses.     Heart sounds: Normal heart sounds.  Pulmonary:     Effort: Pulmonary effort is normal. No respiratory distress.  Abdominal:     General: There is no distension.     Palpations: Abdomen is soft.  Musculoskeletal:        General: Swelling present. No deformity.     Cervical back: Normal range of motion and neck supple. No rigidity.     Right lower leg: No edema.     Left lower leg: Edema present.     Comments: Examination of both knees does appear to be that the left knee has some swelling and there is some left lower leg swelling and this is present on the right as well.  Mild varus deformity  bilaterally.  He really has no joint line tenderness to palpation.  No pain over palpation of the lateral iliotibial band.  There is crepitus of the left knee compared to right.  Negative Lachman's test.  Good varus and valgus stability. No patella Ballottement. Negative McMurrays on the left but some crepitus but clicking, no locking.  Skin:    General: Skin is warm and dry.     Findings: No erythema or rash.  Neurological:     General: No focal deficit present.     Mental Status: He is alert and oriented to person, place, and time.     Sensory: No sensory deficit.  Motor: No weakness.     Coordination: Coordination normal.     Gait: Gait abnormal.  Psychiatric:        Mood and Affect: Mood normal.        Behavior: Behavior normal.     Ortho Exam  Imaging: XR C-ARM NO REPORT  Result Date: 03/26/2020 Please see Notes tab for imaging impression.  XR Knee 1-2 Views Left  Result Date: 03/27/2020 AP and lateral knee shows medial compartment arthritis with some loss of joint space on the left.  Mild sign of effusion on the left.   Past Medical/Family/Surgical/Social History: Medications & Allergies reviewed per EMR, new medications updated. Patient Active Problem List   Diagnosis Date Noted  . Type 2 diabetes mellitus without complication, without long-term current use of insulin (HCC) 04/08/2018  . Essential hypertension 04/08/2018  . BMI 39.0-39.9,adult 04/08/2018  . Testosterone deficiency in male 04/08/2018   Past Medical History:  Diagnosis Date  . BMI 40.0-44.9, adult (HCC)   . Diabetes mellitus due to underlying condition with hyperosmolarity without coma, without long-term current use of insulin (HCC)   . Essential hypertension   . Hyperlipidemia    Phreesia 02/19/2020  . Hypertension    Phreesia 02/19/2020  . Mixed hyperlipidemia   . OSA (obstructive sleep apnea)    Family History  Problem Relation Age of Onset  . Diabetes Mother   . Alzheimer's disease  Father    Past Surgical History:  Procedure Laterality Date  . NO PAST SURGERIES     Social History   Occupational History  . Not on file  Tobacco Use  . Smoking status: Never Smoker  . Smokeless tobacco: Never Used  Vaping Use  . Vaping Use: Never used  Substance and Sexual Activity  . Alcohol use: Not Currently  . Drug use: Never  . Sexual activity: Yes    Partners: Female

## 2020-03-26 NOTE — Progress Notes (Signed)
Left knee pain for a few months. Swelling which has improved since using arthritis cream. Difficulty bending knee. Numeric Pain Rating Scale and Functional Assessment Average Pain 8 Pain Right Now 5 My pain is intermittent and aching Pain is worse with: walking and some activites Pain improves with: straightening knee   In the last MONTH (on 0-10 scale) has pain interfered with the following?  1. General activity like being  able to carry out your everyday physical activities such as walking, climbing stairs, carrying groceries, or moving a chair?  Rating(1)  2. Relation with others like being able to carry out your usual social activities and roles such as  activities at home, at work and in your community. Rating(1)  3. Enjoyment of life such that you have  been bothered by emotional problems such as feeling anxious, depressed or irritable?  Rating(3)

## 2020-03-27 ENCOUNTER — Encounter: Payer: Self-pay | Admitting: Physical Medicine and Rehabilitation

## 2020-03-27 MED ORDER — BUPIVACAINE HCL 0.25 % IJ SOLN
4.0000 mL | INTRAMUSCULAR | Status: AC | PRN
  Administered 2020-03-26: 4 mL via INTRA_ARTICULAR

## 2020-03-27 MED ORDER — TRIAMCINOLONE ACETONIDE 40 MG/ML IJ SUSP
40.0000 mg | INTRAMUSCULAR | Status: AC | PRN
  Administered 2020-03-26: 40 mg via INTRA_ARTICULAR

## 2020-04-18 DIAGNOSIS — Z03818 Encounter for observation for suspected exposure to other biological agents ruled out: Secondary | ICD-10-CM | POA: Diagnosis not present

## 2020-06-02 ENCOUNTER — Other Ambulatory Visit: Payer: Federal, State, Local not specified - PPO

## 2020-06-02 ENCOUNTER — Ambulatory Visit: Payer: Federal, State, Local not specified - PPO | Admitting: Internal Medicine

## 2020-06-02 DIAGNOSIS — Z114 Encounter for screening for human immunodeficiency virus [HIV]: Secondary | ICD-10-CM | POA: Diagnosis not present

## 2020-06-02 DIAGNOSIS — E119 Type 2 diabetes mellitus without complications: Secondary | ICD-10-CM | POA: Diagnosis not present

## 2020-06-03 LAB — HEMOGLOBIN A1C
Est. average glucose Bld gHb Est-mCnc: 177 mg/dL
Hgb A1c MFr Bld: 7.8 % — ABNORMAL HIGH (ref 4.8–5.6)

## 2020-06-03 LAB — MICROALBUMIN / CREATININE URINE RATIO
Creatinine, Urine: 164.5 mg/dL
Microalb/Creat Ratio: 9 mg/g creat (ref 0–29)
Microalbumin, Urine: 14.6 ug/mL

## 2020-06-03 LAB — HIV ANTIBODY (ROUTINE TESTING W REFLEX): HIV Screen 4th Generation wRfx: NONREACTIVE

## 2020-06-23 ENCOUNTER — Telehealth: Payer: Self-pay

## 2020-06-23 NOTE — Telephone Encounter (Signed)
Pt declines any need for additional medications r/t DM/A1C, states that he is still drinking lots of sodas, he will continue to work on diet and activity

## 2020-06-23 NOTE — Telephone Encounter (Signed)
Pt called for lab results from 05/2020, Pt aware of results and voiced understanding, all questions answered/concerns addressed.

## 2020-11-12 ENCOUNTER — Other Ambulatory Visit: Payer: Self-pay

## 2020-11-12 ENCOUNTER — Ambulatory Visit: Payer: Self-pay

## 2020-11-12 ENCOUNTER — Other Ambulatory Visit: Payer: Self-pay | Admitting: Family Medicine

## 2020-11-12 DIAGNOSIS — M25511 Pain in right shoulder: Secondary | ICD-10-CM

## 2020-11-20 ENCOUNTER — Encounter: Payer: Self-pay | Admitting: Orthopaedic Surgery

## 2020-11-20 ENCOUNTER — Ambulatory Visit (INDEPENDENT_AMBULATORY_CARE_PROVIDER_SITE_OTHER): Payer: Worker's Compensation | Admitting: Orthopaedic Surgery

## 2020-11-20 DIAGNOSIS — M25511 Pain in right shoulder: Secondary | ICD-10-CM

## 2020-11-20 MED ORDER — METHYLPREDNISOLONE ACETATE 40 MG/ML IJ SUSP
40.0000 mg | INTRAMUSCULAR | Status: AC | PRN
  Administered 2020-11-20: 40 mg via INTRA_ARTICULAR

## 2020-11-20 MED ORDER — METHOCARBAMOL 500 MG PO TABS: 500.0000 mg | ORAL_TABLET | Freq: Four times a day (QID) | ORAL | 1 refills | Status: DC | PRN

## 2020-11-20 MED ORDER — LIDOCAINE HCL 1 % IJ SOLN
3.0000 mL | INTRAMUSCULAR | Status: AC | PRN
  Administered 2020-11-20: 3 mL

## 2020-11-20 MED ORDER — NABUMETONE 750 MG PO TABS: 750.0000 mg | ORAL_TABLET | Freq: Two times a day (BID) | ORAL | 1 refills | Status: DC | PRN

## 2020-11-20 NOTE — Progress Notes (Signed)
Office Visit Note   Patient: Thomas Cummings           Date of Birth: 11-12-1965           MRN: [id number] Visit Date: 11/20/2020              Requested by: Arvilla Market, DO [address] [place],  [place] [address] PCP: Arvilla Market, DO   Assessment & Plan: Visit Diagnoses:  1. Acute pain of right shoulder     Plan: Given the severity of his right shoulder pain, I did recommend a steroid injection in the right shoulder subacromial outlet and did inject his shoulder without difficulty and he tolerated it well.  He has profound weakness of that right shoulder so a MRI is warranted of the right shoulder to rule out a rotator cuff tear.  I will try Relafen and Robaxin for pain and inflammation and hopefully relax the muscles of the shoulder.  He will continue on his current level of light duty work until further notice.  We will see him back once we have the MRI of his right shoulder.  All questions and concerns were answered and addressed.  I did give him a note reflecting this.  Follow-Up Instructions: No follow-ups on file.   Follow-up after MRI.  Orders:  Orders Placed This Encounter  Procedures   Large Joint Inj: R subacromial bursa   Meds ordered this encounter  Medications   methocarbamol (ROBAXIN) 500 MG tablet    Sig: Take 1 tablet (500 mg total) by mouth every 6 (six) hours as needed.    Dispense:  40 tablet    Refill:  1      Procedures: Large Joint Inj: R subacromial bursa on 11/20/2020 4:16 PM Indications: pain and diagnostic evaluation Details: 22 G 1.5 in needle  Arthrogram: No  Medications: 3 mL lidocaine 1 %; 40 mg methylPREDNISolone acetate 40 MG/ML Outcome: tolerated well, no immediate complications Procedure, treatment alternatives, risks and benefits explained, specific risks discussed. Consent was given by the patient. Immediately prior to procedure a time out was called to verify the correct patient, procedure, equipment,  support staff and site/side marked as required. Patient was prepped and draped in the usual sterile fashion.      Clinical Data: No additional findings.   Subjective: Chief Complaint  Patient presents with   Right Shoulder - Pain  The patient is a 55 year old gentleman I am seeing for the first time.  He injured his right shoulder which is his dominant side on 11/11/2020 and a work-related accident.  He fell that dominant arm very hard and really jammed the right shoulder.  It has been hurting from his shoulder down to the elbow.  He is having a hard time lifting his arm above his head on the right side.  He says he actually injured his right shoulder about a year ago as well in a work-related accident.  He has x-rays on the canopy system for me to review.  He is a diabetic but is under good control.  He is having limited range of motion and weakness with the right shoulder.  He is working right now but only light duty.  He denies any numbness and tingling in his right hand or any neck issues.    HPI  Review of Systems There is currently listed no headache, chest pain, shortness of breath, fever, chills, nausea, vomiting  Objective: Vital Signs: There were no vitals taken for this  visit.  Physical Exam He is alert and orient x3 and in no acute distress Ortho Exam Examination of his right shoulder is quite difficult due to the severity of his right shoulder pain.  It is clinically well located but he can barely abduct his shoulder and I can barely rotate it without significant and severe pain with the right shoulder.  I cannot get a full exam of the shoulder due to the severity of his pain. Specialty Comments:  No specialty comments available.  Imaging: No results found. X-rays on the canopy system of the right shoulder show no acute injury or fracture.  The shoulder is well located.  There is significant AC joint arthritis.  PMFS History: Patient Active Problem List   Diagnosis  Date Noted   Type 2 diabetes mellitus without complication, without long-term current use of insulin (HCC) 04/08/2018   Essential hypertension 04/08/2018   BMI 39.0-39.9,adult 04/08/2018   Testosterone deficiency in male 04/08/2018   Past Medical History:  Diagnosis Date   BMI 40.0-44.9, adult (HCC)    Diabetes mellitus due to underlying condition with hyperosmolarity without coma, without long-term current use of insulin (HCC)    Essential hypertension    Hyperlipidemia    Phreesia 02/19/2020   Hypertension    Phreesia 02/19/2020   Mixed hyperlipidemia    OSA (obstructive sleep apnea)     Family History  Problem Relation Age of Onset   Diabetes Mother    Alzheimer's disease Father     Past Surgical History:  Procedure Laterality Date   NO PAST SURGERIES     Social History   Occupational History   Not on file  Tobacco Use   Smoking status: Never   Smokeless tobacco: Never  Vaping Use   Vaping Use: Never used  Substance and Sexual Activity   Alcohol use: Not Currently   Drug use: Never   Sexual activity: Yes    Partners: Female

## 2020-11-20 NOTE — Addendum Note (Signed)
Addended by: Barbette Or on: 11/20/2020 04:58 PM   Modules accepted: Orders

## 2020-11-28 ENCOUNTER — Telehealth: Payer: Self-pay

## 2020-11-28 NOTE — Telephone Encounter (Signed)
Thomas Cummings from one call called she is requesting a doctors order for the referral she received for a mri fax:[phone] call back:[phone]

## 2020-12-02 ENCOUNTER — Ambulatory Visit: Payer: Federal, State, Local not specified - PPO | Admitting: Orthopaedic Surgery

## 2020-12-10 ENCOUNTER — Encounter: Payer: Self-pay | Admitting: Orthopaedic Surgery

## 2020-12-10 ENCOUNTER — Ambulatory Visit (INDEPENDENT_AMBULATORY_CARE_PROVIDER_SITE_OTHER): Payer: Worker's Compensation | Admitting: Orthopaedic Surgery

## 2020-12-10 DIAGNOSIS — S46011D Strain of muscle(s) and tendon(s) of the rotator cuff of right shoulder, subsequent encounter: Secondary | ICD-10-CM

## 2020-12-10 DIAGNOSIS — M25511 Pain in right shoulder: Secondary | ICD-10-CM

## 2020-12-10 DIAGNOSIS — S46011A Strain of muscle(s) and tendon(s) of the rotator cuff of right shoulder, initial encounter: Secondary | ICD-10-CM | POA: Insufficient documentation

## 2020-12-10 NOTE — Progress Notes (Signed)
The patient is following up after having a MRI of his right shoulder.  He had an acute injury to the right shoulder and profound weakness with the rotator cuff.  This was a work-related accident as well.  He still has shoulder weakness on my exam today with abduction and external rotation.  It is painful for him to do that as well.  The MRI of his right shoulder does show a full-thickness rotator cuff tear of the supraspinatus tendon.  This does appear to be an acute tear.  There is no muscle atrophy and there is edema associated with this tear.  There is retraction of about 1.5 cm.  He does have moderate AC joint arthritis.  The remainder of the rotator cuff is intact.  There is some intra-articular tearing of the biceps tendon.  The cartilage in the glenohumeral joint appears normal.  At this point we are recommending an arthroscopic rotator cuff repair of the right shoulder.  I showed him a shoulder model and described in detail what the surgery involves.  We talked about his intraoperative and postoperative course and what to expect during the recovery process.  The risks and benefits of surgery were described in detail.  All questions and concerns were answered and addressed.  For now we will keep him on light duty with no overhead lifting or repetitive work with the right arm and lifting no greater than 5 pounds.  We will be in touch about scheduling surgery once we have all approval from [employer].

## 2020-12-15 NOTE — Progress Notes (Signed)
Patient ID: Thomas Cummings, male    DOB: 11/21/1965  MRN: [id number]  CC: Hypertension Follow-Up  Subjective: Thomas Cummings is a 55 y.o. male who presents for hypertension follow-up.   His concerns today include:   HYPERTENSION FOLLOW-UP: 02/19/2020 per DO note: BP seems to be at goal per patient home readings. Asymptomatic. Continue current regimen. Given use of Ace inhibitor, thiazide diuretic, and K sparing diuretic monitor BMET.   12/16/2020: Currently taking: see medication list Med Adherence: [x]  Yes    []  No Medication side effects: []  Yes    [x]  No Adherence with salt restriction (low-salt diet): []  Yes  [x]  No Exercise: Yes []  No [x]  Home Monitoring?: [x]  Yes    []  No Monitoring Frequency: [x]  Yes    []  No Home BP results range: [x]  Yes, similar to office reading today Smoking []  Yes [x]  No SOB? []  Yes    [x]  No Chest Pain?: []  Yes    [x]  No Leg swelling?: []  Yes    [x]  No Headaches?: []  Yes    [x]  No Dizziness? []  Yes    [x]  No Comments:   2. DIABETES TYPE 2 FOLLOW-UP: 02/19/2020 per DO note: Last A1c showed good glycemic control. Reports compliance with Metformin. Unsure of current blood sugar trend as patient does not monitor at home. Repeat A1c. metformin.   12/16/2020: Med Adherence:  [x]  Yes    []  No Medication side effects:  []  Yes    [x]  No Home Monitoring?  []  Yes    [x]  No Diet Adherence: Reports he is drinking sodas multiple times daily. Says he does drink some water but having more sodas than water. Having bread. Has tried to cut back on other foods. Exercise: []  Yes    [x]  No  3. HYPERLIPIDEMIA FOLLOW-UP: 02/19/2020 per DO note: Simvastatin  12/16/2020: Med Adherence: [x]  Yes    []  No Medication side effects: []  Yes    [x]  No Muscle aches:  []  Yes    [x]  No  4. SLEEP APNEA: Has a CPAP machine that was placed on recall. Requesting new order for CPAP which is compatible with his health insurance. Patient is unsure of the brand, settings, or mask size of  current CPAP. Reports he will call back to office with details once he returns home.   Patient Active Problem List   Diagnosis Date Noted   Traumatic complete tear of right rotator cuff 12/10/2020   Type 2 diabetes mellitus without complication, without long-term current use of insulin (HCC) 04/08/2018   Essential hypertension 04/08/2018   BMI 39.0-39.9,adult 04/08/2018   Testosterone deficiency in male 04/08/2018     No current outpatient medications on file prior to visit.   No current facility-administered medications on file prior to visit.    No Known Allergies  Social History   Socioeconomic History   Marital status: Married    Spouse name: Not on file   Number of children: Not on file   Years of education: Not on file   Highest education level: Not on file  Occupational History   Not on file  Tobacco Use   Smoking status: Never   Smokeless tobacco: Never  Vaping Use   Vaping Use: Never used  Substance and Sexual Activity   Alcohol use: Not Currently   Drug use: Never   Sexual activity: Yes    Partners: Female  Other Topics Concern   Not on file  Social History Narrative   Not on file  Social Determinants of Health   Financial Resource Strain: Not on file  Food Insecurity: Not on file  Transportation Needs: Not on file  Physical Activity: Not on file  Stress: Not on file  Social Connections: Not on file  Intimate Partner Violence: Not on file    Family History  Problem Relation Age of Onset   Diabetes Mother    Alzheimer's disease Father     Past Surgical History:  Procedure Laterality Date   NO PAST SURGERIES      ROS: Review of Systems Negative except as stated above  PHYSICAL EXAM: BP 121/85 (BP Location: Left Arm, Patient Position: Sitting, Cuff Size: Normal)   Pulse 83   Temp 98.1 F (36.7 C)   Resp 15   Ht 5' 11.58" (1.818 m)   Wt 274 lb (124.3 kg)   SpO2 93%   BMI 37.60 kg/m   Physical Exam HENT:     Head: Normocephalic  and atraumatic.  Eyes:     Extraocular Movements: Extraocular movements intact.     Conjunctiva/sclera: Conjunctivae normal.     Pupils: Pupils are equal, round, and reactive to light.  Cardiovascular:     Rate and Rhythm: Normal rate and regular rhythm.     Pulses: Normal pulses.     Heart sounds: Normal heart sounds.  Pulmonary:     Effort: Pulmonary effort is normal.     Breath sounds: Normal breath sounds.  Musculoskeletal:     Cervical back: Normal range of motion and neck supple.  Neurological:     General: No focal deficit present.     Mental Status: He is alert and oriented to person, place, and time.  Psychiatric:        Mood and Affect: Mood normal.        Behavior: Behavior normal.    ASSESSMENT AND PLAN: 1. Essential hypertension: - Blood pressure at goal during today's visit. Patient reports home blood pressure are similar to today's office reading.  - Continue Amlodipine, Carvedilol, Hydrochlorothiazide, Lisinopril, and Spironolactone as prescribed.  - Counseled on blood pressure goal of less than 130/80, low-sodium, DASH diet, medication compliance, 150 minutes of moderate intensity exercise per week as tolerated. Discussed medication compliance, adverse effects. - BMP to evaluate kidney function and electrolyte balance. - Follow-up with primary provider in 3 months or sooner if needed.  - Basic Metabolic Panel - amLODipine (NORVASC) 10 MG tablet; Take 1 tablet (10 mg total) by mouth daily.  Dispense: 90 tablet; Refill: 0 - carvedilol (COREG) 12.5 MG tablet; Take 1 tablet (12.5 mg total) by mouth 2 (two) times daily.  Dispense: 180 tablet; Refill: 0 - hydrochlorothiazide (HYDRODIURIL) 25 MG tablet; Take 1 tablet (25 mg total) by mouth daily.  Dispense: 90 tablet; Refill: 0 - lisinopril (ZESTRIL) 40 MG tablet; Take 1 tablet (40 mg total) by mouth daily.  Dispense: 90 tablet; Refill: 0 - spironolactone (ALDACTONE) 25 MG tablet; Take 1 tablet (25 mg total) by mouth 2  (two) times daily.  Dispense: 180 tablet; Refill: 0  2. Type 2 diabetes mellitus without complication, without long-term current use of insulin (HCC): - Hemoglobin A1c not at goal today at 8.7%, goal < 7%. This is increased from previous hemoglobin A1c of 7.8% on 06/02/2020. Next hemoglobin A1c due October 2022.  - Continue Metformin as prescribed.  - Begin Dapagliflozin Propanediol as prescribed.  - Discussed the importance of healthy eating habits, low-carbohydrate diet, low-sugar diet, regular aerobic exercise (at least 150 minutes a week  as tolerated) and medication compliance to achieve or maintain control of diabetes. - Follow-up with primary provider in 4 weeks or sooner if needed.  - POCT glycosylated hemoglobin (Hb A1C) - Basic Metabolic Panel - dapagliflozin propanediol (FARXIGA) 5 MG TABS tablet; Take 1 tablet (5 mg total) by mouth daily before breakfast.  Dispense: 60 tablet; Refill: 0 - metFORMIN (GLUCOPHAGE) 1000 MG tablet; Take 1 tablet (1,000 mg total) by mouth 2 (two) times daily with a meal.  Dispense: 180 tablet; Refill: 0  3. Hyperlipidemia associated with type 2 diabetes mellitus (HCC): -Practice low-fat heart healthy diet and at least 150 minutes of moderate intensity exercise weekly as tolerated.  - Continue Atorvastatin as prescribed.  - Follow-up with primary provider as scheduled.  - atorvastatin (LIPITOR) 40 MG tablet; Take 1 tablet (40 mg total) by mouth daily.  Dispense: 90 tablet; Refill: 0  4. Obstructive sleep apnea: - Patient reports he Hhas a CPAP machine that was placed on recall. Requesting new order for CPAP which is compatible with his health insurance. Patient is unsure of the brand, settings, or mask size of current CPAP. Reports he will call back to office with details once he returns home. - Misc. Devices MISC; 1 each by Does not apply route at bedtime. Please provide patient with insurance approved CPAP. Patient reports he is unsure of settings and mask  size. Reports he would check once returning home.  Dispense: 1 each; Refill: 0    Patient was given the opportunity to ask questions.  Patient verbalized understanding of the plan and was able to repeat key elements of the plan. Patient was given clear instructions to go to Emergency Department or return to medical center if symptoms don't improve, worsen, or new problems develop.The patient verbalized understanding.   Orders Placed This Encounter  Procedures   Basic Metabolic Panel   POCT glycosylated hemoglobin (Hb A1C)     Requested Prescriptions   Signed Prescriptions Disp Refills   dapagliflozin propanediol (FARXIGA) 5 MG TABS tablet 60 tablet 0    Sig: Take 1 tablet (5 mg total) by mouth daily before breakfast.   amLODipine (NORVASC) 10 MG tablet 90 tablet 0    Sig: Take 1 tablet (10 mg total) by mouth daily.   atorvastatin (LIPITOR) 40 MG tablet 90 tablet 0    Sig: Take 1 tablet (40 mg total) by mouth daily.   carvedilol (COREG) 12.5 MG tablet 180 tablet 0    Sig: Take 1 tablet (12.5 mg total) by mouth 2 (two) times daily.   hydrochlorothiazide (HYDRODIURIL) 25 MG tablet 90 tablet 0    Sig: Take 1 tablet (25 mg total) by mouth daily.   lisinopril (ZESTRIL) 40 MG tablet 90 tablet 0    Sig: Take 1 tablet (40 mg total) by mouth daily.   metFORMIN (GLUCOPHAGE) 1000 MG tablet 180 tablet 0    Sig: Take 1 tablet (1,000 mg total) by mouth 2 (two) times daily with a meal.   spironolactone (ALDACTONE) 25 MG tablet 180 tablet 0    Sig: Take 1 tablet (25 mg total) by mouth 2 (two) times daily.   Misc. Devices MISC 1 each 0    Sig: 1 each by Does not apply route at bedtime. Please provide patient with insurance approved CPAP. Patient reports he is unsure of settings and mask size. Reports he would check once returning home.    Return in about 4 weeks (around 01/13/2021) for Follow-Up diabetes .  Sharay Bellissimo Jodi Geralds,  NP

## 2020-12-16 ENCOUNTER — Other Ambulatory Visit: Payer: Self-pay

## 2020-12-16 ENCOUNTER — Ambulatory Visit: Payer: Federal, State, Local not specified - PPO | Admitting: Family

## 2020-12-16 VITALS — BP 121/85 | HR 83 | Temp 98.1°F | Resp 15 | Ht 71.58 in | Wt 274.0 lb

## 2020-12-16 DIAGNOSIS — E1169 Type 2 diabetes mellitus with other specified complication: Secondary | ICD-10-CM

## 2020-12-16 DIAGNOSIS — G4733 Obstructive sleep apnea (adult) (pediatric): Secondary | ICD-10-CM

## 2020-12-16 DIAGNOSIS — I10 Essential (primary) hypertension: Secondary | ICD-10-CM

## 2020-12-16 DIAGNOSIS — E785 Hyperlipidemia, unspecified: Secondary | ICD-10-CM | POA: Diagnosis not present

## 2020-12-16 DIAGNOSIS — E119 Type 2 diabetes mellitus without complications: Secondary | ICD-10-CM | POA: Diagnosis not present

## 2020-12-16 LAB — POCT GLYCOSYLATED HEMOGLOBIN (HGB A1C): Hemoglobin A1C: 8.7 % — AB (ref 4.0–5.6)

## 2020-12-16 MED ORDER — SPIRONOLACTONE 25 MG PO TABS
25.0000 mg | ORAL_TABLET | Freq: Two times a day (BID) | ORAL | 0 refills | Status: DC
Start: 2020-12-16 — End: 2021-04-17

## 2020-12-16 MED ORDER — DAPAGLIFLOZIN PROPANEDIOL 5 MG PO TABS: 5.0000 mg | ORAL_TABLET | Freq: Every day | ORAL | 0 refills | Status: DC

## 2020-12-16 MED ORDER — MISC. DEVICES MISC: 1.0000 | Freq: Every day | 0 refills | Status: AC

## 2020-12-16 MED ORDER — ATORVASTATIN CALCIUM 40 MG PO TABS: 40.0000 mg | ORAL_TABLET | Freq: Every day | ORAL | 0 refills | Status: DC

## 2020-12-16 MED ORDER — LISINOPRIL 40 MG PO TABS: 40.0000 mg | ORAL_TABLET | Freq: Every day | ORAL | 0 refills | Status: DC

## 2020-12-16 MED ORDER — AMLODIPINE BESYLATE 10 MG PO TABS: 10.0000 mg | ORAL_TABLET | Freq: Every day | ORAL | 0 refills | Status: DC

## 2020-12-16 MED ORDER — METFORMIN HCL 1000 MG PO TABS: 1000.0000 mg | ORAL_TABLET | Freq: Two times a day (BID) | ORAL | 0 refills | Status: DC

## 2020-12-16 MED ORDER — HYDROCHLOROTHIAZIDE 25 MG PO TABS: 25.0000 mg | ORAL_TABLET | Freq: Every day | ORAL | 0 refills | Status: DC

## 2020-12-16 MED ORDER — CARVEDILOL 12.5 MG PO TABS: 12.5000 mg | ORAL_TABLET | Freq: Two times a day (BID) | ORAL | 0 refills | Status: DC

## 2020-12-16 NOTE — Progress Notes (Signed)
Pt presents for hypertension follow-up, pt needs refills on all meds on chart

## 2020-12-17 ENCOUNTER — Telehealth: Payer: Self-pay | Admitting: Family

## 2020-12-17 LAB — BASIC METABOLIC PANEL
BUN/Creatinine Ratio: 17 (ref 9–20)
BUN: 20 mg/dL (ref 6–24)
CO2: 20 mmol/L (ref 20–29)
Calcium: 9.9 mg/dL (ref 8.7–10.2)
Chloride: 100 mmol/L (ref 96–106)
Creatinine, Ser: 1.15 mg/dL (ref 0.76–1.27)
Glucose: 117 mg/dL — ABNORMAL HIGH (ref 65–99)
Potassium: 4.3 mmol/L (ref 3.5–5.2)
Sodium: 138 mmol/L (ref 134–144)
eGFR: 75 mL/min/{1.73_m2} (ref >59)

## 2020-12-17 NOTE — Telephone Encounter (Signed)
Patient came in the office stating the CPAP needs to be DOT certified.  Please call and advise patient.

## 2020-12-17 NOTE — Progress Notes (Signed)
Please call patient with update.   Kidney function normal.   Diabetes discussed in office.

## 2020-12-18 NOTE — Telephone Encounter (Signed)
Called patient and he is currently using singular sleep cpap machine. He stated that is the machine that is on recall. 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 Patient is unsure as to what he needs to do. He stated he knows he had to get a sleep study to get his current CPAP machine and is not sure if he needs to do that again.

## 2021-01-06 ENCOUNTER — Telehealth: Payer: Self-pay

## 2021-01-14 ENCOUNTER — Ambulatory Visit: Payer: Federal, State, Local not specified - PPO | Admitting: Family

## 2021-01-14 NOTE — Telephone Encounter (Signed)
Error

## 2021-01-16 ENCOUNTER — Other Ambulatory Visit: Payer: Self-pay

## 2021-01-16 ENCOUNTER — Ambulatory Visit (INDEPENDENT_AMBULATORY_CARE_PROVIDER_SITE_OTHER): Payer: Federal, State, Local not specified - PPO | Admitting: Family

## 2021-01-16 ENCOUNTER — Encounter: Payer: Self-pay | Admitting: Family

## 2021-01-16 VITALS — BP 108/75 | HR 75 | Temp 97.3°F | Resp 16 | Ht 71.0 in | Wt 275.0 lb

## 2021-01-16 DIAGNOSIS — E119 Type 2 diabetes mellitus without complications: Secondary | ICD-10-CM

## 2021-01-16 LAB — GLUCOSE, POCT (MANUAL RESULT ENTRY): POC Glucose: 120 mg/dl — AB (ref 70–99)

## 2021-01-16 MED ORDER — BLOOD GLUCOSE METER KIT: PACK | 0 refills | Status: DC

## 2021-01-16 NOTE — Progress Notes (Signed)
Thomas Cummings, is a 55 y.o. male  QJJ:[id number]  GYJ:[id number]  DOB - Oct 16, 1965  Subjective:  Chief Complaint and HPI: Thomas Cummings is a 55 y.o. male who presented to the clinic this afternoon for follow-up for his diabetes.  Patient was last seen at the clinic on December 16, 2020 and was found to have elevated blood glucose was instructed to follow-up today after his medications were adjusted by his primary care provider.  Patient does not check his blood sugar as he does not have any supplies.  Denies polyuria, polyphagia, polydipsia or any other symptoms.  Patient says it is hard for him to quit drinking sodas.  Has not had eye exam for more than a year.  No chest pain or shortness of breath today.  ED/Hospital notes reviewed.    ROS:   Constitutional:  No f/c, No night sweats, No unexplained weight loss. EENT:  No vision changes, No blurry vision, No hearing changes. No mouth, throat, or ear problems.  Respiratory: No cough, No SOB Cardiac: No CP, no palpitations GI:  No abd pain, No N/V/D. GU: No Urinary s/sx Musculoskeletal: No joint pain Neuro: No headache, no dizziness, no motor weakness.  Endocrine:  No polydipsia. No polyuria, no polyphagia.  Psych: Denies SI/HI  No problems updated.  ALLERGIES: No Known Allergies  PAST MEDICAL HISTORY: Past Medical History:  Diagnosis Date   BMI 40.0-44.9, adult ([place])    Diabetes mellitus due to underlying condition with hyperosmolarity without coma, without long-term current use of insulin ([place])    Essential hypertension    Hyperlipidemia    Phreesia 02/19/2020   Hypertension    Phreesia 02/19/2020   Mixed hyperlipidemia    OSA (obstructive sleep apnea)     MEDICATIONS AT HOME: Prior to Admission medications   Medication Sig Start Date End Date Taking? Authorizing Provider  amLODipine (NORVASC) 10 MG tablet Take 1 tablet (10 mg total) by mouth daily. 12/16/20 03/16/21 Yes Minette Brine, Amy J, NP  atorvastatin (LIPITOR) 40 MG  tablet Take 1 tablet (40 mg total) by mouth daily. 12/16/20 03/16/21 Yes Camillia Herter, NP  blood glucose meter kit and supplies Dispense based on patient and insurance preference. Use up to four times daily as directed. (FOR ICD-10 E10.9, E11.9). 01/16/21  Yes Feliberto Gottron, FNP  carvedilol (COREG) 12.5 MG tablet Take 1 tablet (12.5 mg total) by mouth 2 (two) times daily. 12/16/20 03/16/21 Yes Camillia Herter, NP  dapagliflozin propanediol (FARXIGA) 5 MG TABS tablet Take 1 tablet (5 mg total) by mouth daily before breakfast. 12/16/20 02/14/21 Yes Minette Brine, Amy J, NP  hydrochlorothiazide (HYDRODIURIL) 25 MG tablet Take 1 tablet (25 mg total) by mouth daily. 12/16/20 03/16/21 Yes Minette Brine, Amy J, NP  lisinopril (ZESTRIL) 40 MG tablet Take 1 tablet (40 mg total) by mouth daily. 12/16/20 03/16/21 Yes Minette Brine, Amy J, NP  metFORMIN (GLUCOPHAGE) 1000 MG tablet Take 1 tablet (1,000 mg total) by mouth 2 (two) times daily with a meal. 12/16/20 03/16/21 Yes Camillia Herter, NP  Misc. Devices MISC 1 each by Does not apply route at bedtime. Please provide patient with insurance approved CPAP. Patient reports he is unsure of settings and mask size. Reports he would check once returning home. 12/16/20 12/16/21 Yes Minette Brine, Amy J, NP  spironolactone (ALDACTONE) 25 MG tablet Take 1 tablet (25 mg total) by mouth 2 (two) times daily. 12/16/20 03/16/21 Yes Camillia Herter, NP     Objective:  EXAM:   Vitals:   01/16/21  1354  BP: 108/75  Pulse: 75  Resp: 16  Temp: (!) 97.3 F (36.3 C)  SpO2: 97%  Weight: 275 lb (124.7 kg)  Height: 5' 11"  (1.803 m)    General appearance : A&OX3. NAD. Non-toxic-appearing HEENT: Atraumatic and Normocephalic.  PERRLA. EOM intact.  TM clear B. Mouth-MMM, post pharynx WNL w/o erythema, No PND. Neck: supple, no JVD. No cervical lymphadenopathy. No thyromegaly Chest/Lungs:  Breathing-non-labored, Good air entry bilaterally, breath sounds normal without rales, rhonchi, or wheezing  CVS: S1 S2 regular,  no murmurs, gallops, rubs  Abdomen: Bowel sounds present, Non tender and not distended with no gaurding, rigidity or rebound. Neurology:  CN II-XII grossly intact, Non focal.   Psych:  TP linear. J/I WNL. Normal speech. Appropriate eye contact and affect.  Skin:  No Rash  Data Review Lab Results  Component Value Date   HGBA1C 8.7 (A) 12/16/2020   HGBA1C 7.8 (H) 06/02/2020   HGBA1C 7.5 (H) 02/19/2020     Assessment & Plan   1. Type 2 diabetes mellitus without complication, without long-term current use of insulin (HCC) -Check blood sugar as instructed, report blood sugar less than 80 or greater than 249 mg/dL the clinic or local ER. -Avoid drinking sodas or any other simple sugar drinks or eating sweet foods -Exercise at least 30 minutes/day for 5 days a week -Any new symptoms to the clinic or local ER - Glucose (CBG) - blood glucose meter kit and supplies; Dispense based on patient and insurance preference. Use up to four times daily as directed. (FOR ICD-10 E10.9, E11.9).  Dispense: 1 each; Refill: 0     Patient have been counseled extensively about nutrition and exercise  Return in about 3 months (around 04/18/2021) for Routine follow up with PCP with new labs..  The patient was given clear instructions to go to ER or return to medical center if symptoms don't improve, worsen or new problems develop. The patient verbalized understanding. The patient was told to call to get lab results if they haven't heard anything in the next week.     Feliberto Gottron, APRN, FNP-C [facility] and [facility], [place]   01/16/2021, 2:32 PM

## 2021-01-16 NOTE — Progress Notes (Signed)
Follow up DM Having surgery next week

## 2021-01-16 NOTE — Patient Instructions (Signed)
Monitor BG at least once a day and multiple times if ill. Take medications as prescribed Avoid Sodas and other simple sugars foods. Eat plant based foods. Exercise at least 30 minutes per dayx5 days Have eyes examined every year at least Call with new symptoms or go to the local ED.

## 2021-01-22 ENCOUNTER — Telehealth: Payer: Self-pay

## 2021-01-22 ENCOUNTER — Other Ambulatory Visit: Payer: Self-pay | Admitting: Orthopaedic Surgery

## 2021-01-22 DIAGNOSIS — S46011D Strain of muscle(s) and tendon(s) of the rotator cuff of right shoulder, subsequent encounter: Secondary | ICD-10-CM | POA: Diagnosis not present

## 2021-01-22 DIAGNOSIS — M7541 Impingement syndrome of right shoulder: Secondary | ICD-10-CM | POA: Diagnosis not present

## 2021-01-22 MED ORDER — OXYCODONE HCL 5 MG PO TABS: 5.0000 mg | ORAL_TABLET | Freq: Four times a day (QID) | ORAL | 0 refills | Status: DC | PRN

## 2021-01-22 MED ORDER — HYDROCODONE-ACETAMINOPHEN 5-325 MG PO TABS: 1.0000 | ORAL_TABLET | Freq: Four times a day (QID) | ORAL | 0 refills | Status: DC | PRN

## 2021-01-22 NOTE — Telephone Encounter (Signed)
Please advise 

## 2021-01-22 NOTE — Telephone Encounter (Signed)
Called and advised.

## 2021-01-22 NOTE — Telephone Encounter (Signed)
Robert with [employer] called stating that insurance said that the max is 4 tablets per day instead of 1-2 tablets for Oxycodone.  Please advise if dosage can be changed.  Cb# [phone].

## 2021-01-29 ENCOUNTER — Other Ambulatory Visit: Payer: Self-pay | Admitting: Family

## 2021-01-29 ENCOUNTER — Ambulatory Visit (INDEPENDENT_AMBULATORY_CARE_PROVIDER_SITE_OTHER): Payer: Federal, State, Local not specified - PPO | Admitting: Physician Assistant

## 2021-01-29 ENCOUNTER — Encounter: Payer: Self-pay | Admitting: Physician Assistant

## 2021-01-29 ENCOUNTER — Other Ambulatory Visit: Payer: Self-pay

## 2021-01-29 DIAGNOSIS — E119 Type 2 diabetes mellitus without complications: Secondary | ICD-10-CM

## 2021-01-29 DIAGNOSIS — Z9889 Other specified postprocedural states: Secondary | ICD-10-CM

## 2021-01-29 MED ORDER — HYDROCODONE-ACETAMINOPHEN 5-325 MG PO TABS: 1.0000 | ORAL_TABLET | Freq: Four times a day (QID) | ORAL | 0 refills | Status: DC | PRN

## 2021-01-29 NOTE — Progress Notes (Signed)
HPI: Mr. Mittleman returns today 1 week status post right shoulder arthroscopy.  He underwent right shoulder arthroscopy with extensive debridement subacromial decompression with partial acromioplasty without cortical who acromial release.  He was found to have partial tears of his rotator cuff but no full-thickness cuff tear.  He had significant bursitis synovitis in the shoulder. He states he is having significant pain in his shoulder he is trying to cut down on the hydrocodone that he is taking for pain.  He is is sleeping sitting upright.  He continues to use the sling.  Physical exam: General well-developed well-nourished male no acute distress mood affect appropriate. Right shoulder port sites are all well approximately 9 suture.  He has a rash about all 3 port sites consistent with a contact dermatitis.  No signs of gross infection.  There is good range of motion right elbow.  Very limited range of motion of the right shoulder secondary to pain.  Impression: Status post right shoulder arthroscopy 01/22/2021.  Plan: He will use a sling for comfort.  He will stop using Neosporin on the wounds.  Wash the wounds with antibacterial soap.  Sutures were removed today.  We will keep him out of work until follow-up in 1 month and reevaluate his work status at that time.  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  Prescriptions given for physical therapy.  Discussed findings with the patient, his wife and his case manager Saundra Shelling RN-BSN, CCM.

## 2021-02-26 ENCOUNTER — Ambulatory Visit (INDEPENDENT_AMBULATORY_CARE_PROVIDER_SITE_OTHER): Payer: Federal, State, Local not specified - PPO | Admitting: Orthopaedic Surgery

## 2021-02-26 ENCOUNTER — Other Ambulatory Visit: Payer: Self-pay

## 2021-02-26 ENCOUNTER — Encounter: Payer: Self-pay | Admitting: Orthopaedic Surgery

## 2021-02-26 ENCOUNTER — Encounter: Payer: Federal, State, Local not specified - PPO | Admitting: Physician Assistant

## 2021-02-26 DIAGNOSIS — Z9889 Other specified postprocedural states: Secondary | ICD-10-CM

## 2021-02-26 DIAGNOSIS — M25511 Pain in right shoulder: Secondary | ICD-10-CM | POA: Diagnosis not present

## 2021-02-26 MED ORDER — LIDOCAINE HCL 1 % IJ SOLN
3.0000 mL | INTRAMUSCULAR | Status: AC | PRN
  Administered 2021-02-26: 3 mL

## 2021-02-26 MED ORDER — METHYLPREDNISOLONE ACETATE 40 MG/ML IJ SUSP
40.0000 mg | INTRAMUSCULAR | Status: AC | PRN
  Administered 2021-02-26: 40 mg via INTRA_ARTICULAR

## 2021-02-26 NOTE — Progress Notes (Signed)
Office Visit Note   Patient: Thomas Cummings           Date of Birth: [birth year]           MRN: [id number] Visit Date: 02/26/2021              Requested by: Camillia Herter, NP [address] [place],  [place] [id number] PCP: Camillia Herter, NP   Assessment & Plan: Visit Diagnoses:  1. S/P arthroscopy of right shoulder     Plan: I examined the patient and we met with the case manager today as well.  I felt it was appropriate to provide a steroid injection subacromial outlet postoperatively not 5 weeks to see if this will calm down some of his pain.  From my standpoint, he is not ready to return to work yet.  We will give a note reflecting that.  He will continue therapy and I would like to see him back in 4 weeks to see how he is doing overall.  All question concerns were answered and addressed.  Follow-Up Instructions: Return in about 4 weeks (around 03/26/2021).   Orders:  No orders of the defined types were placed in this encounter.  No orders of the defined types were placed in this encounter.     Procedures: Large Joint Inj: R subacromial bursa on 02/26/2021 2:11 PM Indications: pain and diagnostic evaluation Details: 22 G 1.5 in needle  Arthrogram: No  Medications: 3 mL lidocaine 1 %; 40 mg methylPREDNISolone acetate 40 MG/ML Outcome: tolerated well, no immediate complications Procedure, treatment alternatives, risks and benefits explained, specific risks discussed. Consent was given by the patient. Immediately prior to procedure a time out was called to verify the correct patient, procedure, equipment, support staff and site/side marked as required. Patient was prepped and draped in the usual sterile fashion.      Clinical Data: No additional findings.   Subjective: Chief Complaint  Patient presents with   Right Shoulder - Post-op Follow-up  The patient is now about 5 weeks today status post a right shoulder extensive debridement and subacromial  decompression arthroscopically.  We debrided partial-thickness rotator cuff tearing.  He is going to physical therapy twice daily.  He is having some sharp pains in the morning and has not really needed to take any pain medicines much at all.  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  He has a case Freight forwarder with him today as well.  He is a prediabetic.  HPI  Review of Systems There is no fever, chills, nausea, vomiting  Objective: Vital Signs: There were no vitals taken for this visit.  Physical Exam He is alert and oriented x3 and in no acute distress Ortho Exam Examination of his right shoulder shows well located.  He still has pain with abduction of the shoulder and some limitations in rotation Specialty Comments:  No specialty comments available.  Imaging: No results found.   PMFS History: Patient Active Problem List   Diagnosis Date Noted   Traumatic complete tear of right rotator cuff 12/10/2020   Type 2 diabetes mellitus without complication, without long-term current use of insulin ([place]) 04/08/2018   Essential hypertension 04/08/2018   BMI 39.0-39.9,adult 04/08/2018   Testosterone deficiency in male 04/08/2018   Past Medical History:  Diagnosis Date   BMI 40.0-44.9, adult ([place])    Diabetes mellitus due to underlying condition with hyperosmolarity without coma, without  long-term current use of insulin ([place])    Essential hypertension    Hyperlipidemia    Phreesia 02/19/2020   Hypertension    Phreesia 02/19/2020   Mixed hyperlipidemia    OSA (obstructive sleep apnea)     Family History  Problem Relation Age of Onset   Diabetes Mother    Alzheimer's disease Father     Past Surgical History:  Procedure Laterality Date   NO PAST SURGERIES     Social History   Occupational History   Not on file  Tobacco Use   Smoking status: Never   Smokeless tobacco: Never  Vaping Use   Vaping Use: Never used  Substance and  Sexual Activity   Alcohol use: Not Currently   Drug use: Never   Sexual activity: Yes    Partners: Female

## 2021-03-25 ENCOUNTER — Ambulatory Visit (INDEPENDENT_AMBULATORY_CARE_PROVIDER_SITE_OTHER): Payer: Worker's Compensation | Admitting: Orthopaedic Surgery

## 2021-03-25 DIAGNOSIS — Z9889 Other specified postprocedural states: Secondary | ICD-10-CM

## 2021-03-25 MED ORDER — CELECOXIB 200 MG PO CAPS: 200.0000 mg | ORAL_CAPSULE | Freq: Two times a day (BID) | ORAL | 1 refills | Status: DC | PRN

## 2021-03-25 NOTE — Progress Notes (Addendum)
The patient is now over 8 weeks status post a right shoulder arthroscopy with extensive debridement and subacromial decompression.  There was significant inflamed tissue in the shoulder and a partial-thickness rotator cuff tear.  This was from a work-related accident.  He does have his case manager with him again today.  At his last visit we did place a steroid injection in his right shoulder subacromial outlet and this did help some.  He has been diligently going to physical therapy.  His job requires a lot of heavy lifting and overhead activity so we have had him out of his work.  He feels like he is making some progress.  On my exam, he still has significant limitations with right shoulder pain and weakness past 90 degrees of abduction.  He is still limited with his internal rotation combined with adduction.  He still shows significant right shoulder pain.  He is not allergic to any type of medications and has no kidney disease.  I have recommended starting an anti-inflammatory.  We will still have him continue aggressive physical therapy for his right shoulder.  I will prescribe Celebrex 200 mg twice daily.  We will reevaluate him in 4 weeks.  I will at least allow only sedentary type of work if they can accommodate him for that with no lifting greater than 5 pounds with the right upper extremity and no overhead activities.

## 2021-04-01 ENCOUNTER — Other Ambulatory Visit: Payer: Self-pay | Admitting: *Deleted

## 2021-04-01 DIAGNOSIS — E1169 Type 2 diabetes mellitus with other specified complication: Secondary | ICD-10-CM

## 2021-04-01 DIAGNOSIS — E785 Hyperlipidemia, unspecified: Secondary | ICD-10-CM

## 2021-04-01 MED ORDER — ATORVASTATIN CALCIUM 40 MG PO TABS: 40.0000 mg | ORAL_TABLET | Freq: Every day | ORAL | 0 refills | Status: DC

## 2021-04-14 DIAGNOSIS — E119 Type 2 diabetes mellitus without complications: Secondary | ICD-10-CM | POA: Diagnosis not present

## 2021-04-16 NOTE — Progress Notes (Signed)
Patient ID: Thomas Cummings, male    DOB: 05-17-1966  MRN: [id number]  CC: Hypertension Follow-Up  Subjective: Thomas Cummings is a 55 y.o. male who presents for hypertension follow-up.   His concerns today include:  HYPERTENSION FOLLOW-UP: 12/16/2020: - Continue Amlodipine, Carvedilol, Hydrochlorothiazide, Lisinopril, and Spironolactone as prescribed.   04/17/2021: Doing well on current regimen. No side effects. No issues/concerns. Denies chest pain and shortness of breath. Home blood pressures 120's/80's.  2. DIABETES TYPE 2 FOLLOW-UP: 12/16/2020: - Continue Metformin as prescribed.  - Begin Dapagliflozin Propanediol as prescribed.   04/17/2021: Doing well on current regimen. No issues/concerns. Does not check blood sugars at home because does not like sticking finger.   Patient Active Problem List   Diagnosis Date Noted   Traumatic complete tear of right rotator cuff 12/10/2020   Type 2 diabetes mellitus without complication, without long-term current use of insulin ([place]) 04/08/2018   Essential hypertension 04/08/2018   BMI 39.0-39.9,adult 04/08/2018   Testosterone deficiency in male 04/08/2018     Current Outpatient Medications on File Prior to Visit  Medication Sig Dispense Refill   atorvastatin (LIPITOR) 40 MG tablet Take 1 tablet (40 mg total) by mouth daily. 90 tablet 0   blood glucose meter kit and supplies Dispense based on patient and insurance preference. Use up to four times daily as directed. (FOR ICD-10 E10.9, E11.9). 1 each 0   celecoxib (CELEBREX) 200 MG capsule Take 1 capsule (200 mg total) by mouth 2 (two) times daily between meals as needed. 60 capsule 1   HYDROcodone-acetaminophen (NORCO/VICODIN) 5-325 MG tablet Take 1-2 tablets by mouth every 6 (six) hours as needed for moderate pain. 30 tablet 0   Misc. Devices MISC 1 each by Does not apply route at bedtime. Please provide patient with insurance approved CPAP. Patient reports he is unsure of settings and mask  size. Reports he would check once returning home. 1 each 0   No current facility-administered medications on file prior to visit.    No Known Allergies  Social History   Socioeconomic History   Marital status: Married    Spouse name: Not on file   Number of children: Not on file   Years of education: Not on file   Highest education level: Not on file  Occupational History   Not on file  Tobacco Use   Smoking status: Never   Smokeless tobacco: Never  Vaping Use   Vaping Use: Never used  Substance and Sexual Activity   Alcohol use: Not Currently   Drug use: Never   Sexual activity: Yes    Partners: Female  Other Topics Concern   Not on file  Social History Narrative   Not on file   Social Determinants of Health   Financial Resource Strain: Not on file  Food Insecurity: Not on file  Transportation Needs: Not on file  Physical Activity: Not on file  Stress: Not on file  Social Connections: Not on file  Intimate Partner Violence: Not on file    Family History  Problem Relation Age of Onset   Diabetes Mother    Alzheimer's disease Father     Past Surgical History:  Procedure Laterality Date   NO PAST SURGERIES      ROS: Review of Systems Negative except as stated above  PHYSICAL EXAM: BP 115/80 (BP Location: Left Arm, Patient Position: Sitting, Cuff Size: Large)   Pulse 76   Temp 98.7 F (37.1 C) (Oral)   Resp 16  Wt 272 lb (123.4 kg)   SpO2 97%   BMI 37.94 kg/m   Physical Exam HENT:     Head: Normocephalic and atraumatic.  Eyes:     Extraocular Movements: Extraocular movements intact.     Conjunctiva/sclera: Conjunctivae normal.     Pupils: Pupils are equal, round, and reactive to light.  Cardiovascular:     Rate and Rhythm: Normal rate and regular rhythm.     Pulses: Normal pulses.     Heart sounds: Normal heart sounds.  Pulmonary:     Effort: Pulmonary effort is normal.     Breath sounds: Normal breath sounds.  Musculoskeletal:      Cervical back: Normal range of motion and neck supple.  Neurological:     General: No focal deficit present.     Mental Status: He is alert and oriented to person, place, and time.  Psychiatric:        Mood and Affect: Mood normal.        Behavior: Behavior normal.   Results for orders placed or performed in visit on 04/17/21  POCT glycosylated hemoglobin (Hb A1C)  Result Value Ref Range   Hemoglobin A1C 8.2 (A) 4.0 - 5.6 %   HbA1c POC (<> result, manual entry)     HbA1c, POC (prediabetic range)     HbA1c, POC (controlled diabetic range)      ASSESSMENT AND PLAN: 1. Essential hypertension: - Continue Amlodipine, Carvedilol, Hydrochlorothiazide, Lisinopril, and Spironolactone as prescribed.  - Counseled on blood pressure goal of less than 130/80, low-sodium, DASH diet, medication compliance, 150 minutes of moderate intensity exercise per week as tolerated. Discussed medication compliance, adverse effects. - BMP to evaluate kidney function and electrolyte balance. - Follow-up with primary provider in 3 months or sooner if needed.  - Basic Metabolic Panel - amLODipine (NORVASC) 10 MG tablet; Take 1 tablet (10 mg total) by mouth daily.  Dispense: 90 tablet; Refill: 0 - carvedilol (COREG) 12.5 MG tablet; Take 1 tablet (12.5 mg total) by mouth 2 (two) times daily.  Dispense: 180 tablet; Refill: 0 - hydrochlorothiazide (HYDRODIURIL) 25 MG tablet; Take 1 tablet (25 mg total) by mouth daily.  Dispense: 90 tablet; Refill: 0 - lisinopril (ZESTRIL) 40 MG tablet; Take 1 tablet (40 mg total) by mouth daily.  Dispense: 90 tablet; Refill: 0 - spironolactone (ALDACTONE) 25 MG tablet; Take 1 tablet (25 mg total) by mouth 2 (two) times daily.  Dispense: 180 tablet; Refill: 0  2. Type 2 diabetes mellitus without complication, without long-term current use of insulin ([place]): - Hemoglobin A1c today not at goal at 8.2%, goal < 7%. However, this is improved from previous of 8.7% on 12/16/2020.  - Continue  Metformin as prescribed.  - Increase Dapagliflozin Propanediol from 5 mg daily to 10 mg daily.  - Discussed the importance of healthy eating habits, low-carbohydrate diet, low-sugar diet, regular aerobic exercise (at least 150 minutes a week as tolerated) and medication compliance to achieve or maintain control of diabetes. - Follow-up with primary provider in 4 weeks or sooner if needed.  - POCT glycosylated hemoglobin (Hb A1C) - metFORMIN (GLUCOPHAGE) 1000 MG tablet; Take 1 tablet (1,000 mg total) by mouth 2 (two) times daily with a meal.  Dispense: 180 tablet; Refill: 0 - dapagliflozin propanediol (FARXIGA) 10 MG TABS tablet; Take 1 tablet (10 mg total) by mouth daily before breakfast.  Dispense: 90 tablet; Refill: 0   Patient was given the opportunity to ask questions.  Patient verbalized understanding of the  plan and was able to repeat key elements of the plan. Patient was given clear instructions to go to Emergency Department or return to medical center if symptoms don't improve, worsen, or new problems develop.The patient verbalized understanding.   Orders Placed This Encounter  Procedures   Basic Metabolic Panel   POCT glycosylated hemoglobin (Hb A1C)    Requested Prescriptions   Signed Prescriptions Disp Refills   amLODipine (NORVASC) 10 MG tablet 90 tablet 0    Sig: Take 1 tablet (10 mg total) by mouth daily.   carvedilol (COREG) 12.5 MG tablet 180 tablet 0    Sig: Take 1 tablet (12.5 mg total) by mouth 2 (two) times daily.   hydrochlorothiazide (HYDRODIURIL) 25 MG tablet 90 tablet 0    Sig: Take 1 tablet (25 mg total) by mouth daily.   lisinopril (ZESTRIL) 40 MG tablet 90 tablet 0    Sig: Take 1 tablet (40 mg total) by mouth daily.   spironolactone (ALDACTONE) 25 MG tablet 180 tablet 0    Sig: Take 1 tablet (25 mg total) by mouth 2 (two) times daily.   metFORMIN (GLUCOPHAGE) 1000 MG tablet 180 tablet 0    Sig: Take 1 tablet (1,000 mg total) by mouth 2 (two) times daily with  a meal.   dapagliflozin propanediol (FARXIGA) 10 MG TABS tablet 90 tablet 0    Sig: Take 1 tablet (10 mg total) by mouth daily before breakfast.    Return in about 3 months (around 07/18/2021) for Follow-Up or next available hypertension and 4 weeks diabetes .  Camillia Herter, NP

## 2021-04-17 ENCOUNTER — Other Ambulatory Visit: Payer: Self-pay

## 2021-04-17 ENCOUNTER — Ambulatory Visit (INDEPENDENT_AMBULATORY_CARE_PROVIDER_SITE_OTHER): Payer: Federal, State, Local not specified - PPO | Admitting: Family

## 2021-04-17 VITALS — BP 115/80 | HR 76 | Temp 98.7°F | Resp 16 | Wt 272.0 lb

## 2021-04-17 DIAGNOSIS — I10 Essential (primary) hypertension: Secondary | ICD-10-CM | POA: Diagnosis not present

## 2021-04-17 DIAGNOSIS — E119 Type 2 diabetes mellitus without complications: Secondary | ICD-10-CM | POA: Diagnosis not present

## 2021-04-17 LAB — POCT GLYCOSYLATED HEMOGLOBIN (HGB A1C): Hemoglobin A1C: 8.2 % — AB (ref 4.0–5.6)

## 2021-04-17 MED ORDER — CARVEDILOL 12.5 MG PO TABS: 12.5000 mg | ORAL_TABLET | Freq: Two times a day (BID) | ORAL | 0 refills | Status: DC

## 2021-04-17 MED ORDER — AMLODIPINE BESYLATE 10 MG PO TABS: 10.0000 mg | ORAL_TABLET | Freq: Every day | ORAL | 0 refills | Status: DC

## 2021-04-17 MED ORDER — METFORMIN HCL 1000 MG PO TABS: 1000.0000 mg | ORAL_TABLET | Freq: Two times a day (BID) | ORAL | 0 refills | Status: DC

## 2021-04-17 MED ORDER — DAPAGLIFLOZIN PROPANEDIOL 10 MG PO TABS
10.0000 mg | ORAL_TABLET | Freq: Every day | ORAL | 0 refills | Status: AC
Start: 2021-04-17 — End: 2021-07-16

## 2021-04-17 MED ORDER — SPIRONOLACTONE 25 MG PO TABS: 25.0000 mg | ORAL_TABLET | Freq: Two times a day (BID) | ORAL | 0 refills | Status: DC

## 2021-04-17 MED ORDER — LISINOPRIL 40 MG PO TABS: 40.0000 mg | ORAL_TABLET | Freq: Every day | ORAL | 0 refills | Status: DC

## 2021-04-17 MED ORDER — HYDROCHLOROTHIAZIDE 25 MG PO TABS: 25.0000 mg | ORAL_TABLET | Freq: Every day | ORAL | 0 refills | Status: DC

## 2021-04-17 NOTE — Progress Notes (Signed)
Patient is concern as to way he is not getting his refills when the pharmacy calls.  Patient said he is just trying to stay on top of everything.

## 2021-04-18 LAB — BASIC METABOLIC PANEL
BUN/Creatinine Ratio: 17 (ref 9–20)
BUN: 19 mg/dL (ref 6–24)
CO2: 23 mmol/L (ref 20–29)
Calcium: 9.7 mg/dL (ref 8.7–10.2)
Chloride: 102 mmol/L (ref 96–106)
Creatinine, Ser: 1.13 mg/dL (ref 0.76–1.27)
Glucose: 119 mg/dL — ABNORMAL HIGH (ref 70–99)
Potassium: 4.2 mmol/L (ref 3.5–5.2)
Sodium: 140 mmol/L (ref 134–144)
eGFR: 76 mL/min/{1.73_m2} (ref >59)

## 2021-04-18 NOTE — Progress Notes (Signed)
Please call patient with update.   Kidney function normal.   Diabetes discussed in office.

## 2021-04-27 ENCOUNTER — Other Ambulatory Visit: Payer: Self-pay

## 2021-04-27 ENCOUNTER — Encounter: Payer: Self-pay | Admitting: Orthopaedic Surgery

## 2021-04-27 ENCOUNTER — Ambulatory Visit (INDEPENDENT_AMBULATORY_CARE_PROVIDER_SITE_OTHER): Payer: Worker's Compensation | Admitting: Orthopaedic Surgery

## 2021-04-27 DIAGNOSIS — Z9889 Other specified postprocedural states: Secondary | ICD-10-CM | POA: Diagnosis not present

## 2021-04-27 NOTE — Progress Notes (Signed)
The patient is a 55 year old who is now just over 3 months status post a right shoulder arthroscopy with extensive debridement and subacromial decompression.  He is at work now for the last 4 weeks but performing light duty work.  He reports still pain and limitations in range of motion of the right shoulder.  I reviewed his arthroscopy pictures and his rotator cuff is intact.  Significant impingement and inflamed tissue in the glenohumeral joint.  There is partial tearing of the rotator cuff.  On my exam today the shoulder does rotate slightly better and is well located.  He still has pain past 90 degrees of abduction and has a hard time reaching behind him.  His case manager is with him today.  I told him that this still can take 5 to 6 months to improve from.  He would definitely benefit from an intra-articular steroid injection in that right shoulder and continue therapy.  I will continue him on light duty work as well.  I would like to reevaluate in 4 weeks.  At that visit we will start the process of work conditioning heading toward an Administrator, arts.  He will continue just over-the-counter anti-inflammatories for now.

## 2021-04-30 ENCOUNTER — Telehealth: Payer: Self-pay | Admitting: Physical Medicine and Rehabilitation

## 2021-04-30 NOTE — Telephone Encounter (Signed)
Patient called. Needing to schedule with Dr. Alvester Morin.

## 2021-05-01 ENCOUNTER — Telehealth: Payer: Self-pay | Admitting: Physical Medicine and Rehabilitation

## 2021-05-01 NOTE — Telephone Encounter (Signed)
See previous message

## 2021-05-01 NOTE — Telephone Encounter (Signed)
Scheduled

## 2021-05-01 NOTE — Telephone Encounter (Signed)
Patient returned call asked for a call back to schedule an appointment with Dr. Newton 

## 2021-05-09 NOTE — Progress Notes (Signed)
Patient ID: Thomas Cummings, male    DOB: March 23, 1966  MRN: [id number]  CC: Diabetes Follow-Up  Subjective: Thomas Cummings is a 55 y.o. male who presents for diabetes follow-up.   His concerns today include:   1. DIABETES FOLLOW-UP: 04/17/2021: - Hemoglobin A1c today not at goal at 8.2%, goal < 7%. However, this is improved from previous of 8.7% on 12/16/2020.  - Continue Metformin as prescribed.  - Increase Dapagliflozin Propanediol from 5 mg daily to 10 mg daily.   05/15/2021: Reports since increasing Farxiga having diarrhea. No additional issues or concerns.  2. COUGH: Began around Thanksgiving. Nonproductive. Denies chest pain, shortness of breath, and additional red flag symptoms. Declines Covid and flu testing today.  Patient Active Problem List   Diagnosis Date Noted   Traumatic complete tear of right rotator cuff 12/10/2020   Type 2 diabetes mellitus without complication, without long-term current use of insulin ([place]) 04/08/2018   Essential hypertension 04/08/2018   BMI 39.0-39.9,adult 04/08/2018   Testosterone deficiency in male 04/08/2018     Current Outpatient Medications on File Prior to Visit  Medication Sig Dispense Refill   amLODipine (NORVASC) 10 MG tablet Take 1 tablet (10 mg total) by mouth daily. 90 tablet 0   atorvastatin (LIPITOR) 40 MG tablet Take 1 tablet (40 mg total) by mouth daily. 90 tablet 0   blood glucose meter kit and supplies Dispense based on patient and insurance preference. Use up to four times daily as directed. (FOR ICD-10 E10.9, E11.9). 1 each 0   carvedilol (COREG) 12.5 MG tablet Take 1 tablet (12.5 mg total) by mouth 2 (two) times daily. 180 tablet 0   celecoxib (CELEBREX) 200 MG capsule Take 1 capsule (200 mg total) by mouth 2 (two) times daily between meals as needed. 60 capsule 1   dapagliflozin propanediol (FARXIGA) 10 MG TABS tablet Take 1 tablet (10 mg total) by mouth daily before breakfast. 90 tablet 0   hydrochlorothiazide  (HYDRODIURIL) 25 MG tablet Take 1 tablet (25 mg total) by mouth daily. 90 tablet 0   HYDROcodone-acetaminophen (NORCO/VICODIN) 5-325 MG tablet Take 1-2 tablets by mouth every 6 (six) hours as needed for moderate pain. 30 tablet 0   lisinopril (ZESTRIL) 40 MG tablet Take 1 tablet (40 mg total) by mouth daily. 90 tablet 0   metFORMIN (GLUCOPHAGE) 1000 MG tablet Take 1 tablet (1,000 mg total) by mouth 2 (two) times daily with a meal. 180 tablet 0   Misc. Devices MISC 1 each by Does not apply route at bedtime. Please provide patient with insurance approved CPAP. Patient reports he is unsure of settings and mask size. Reports he would check once returning home. 1 each 0   spironolactone (ALDACTONE) 25 MG tablet Take 1 tablet (25 mg total) by mouth 2 (two) times daily. 180 tablet 0   No current facility-administered medications on file prior to visit.    No Known Allergies  Social History   Socioeconomic History   Marital status: Married    Spouse name: Not on file   Number of children: Not on file   Years of education: Not on file   Highest education level: Not on file  Occupational History   Not on file  Tobacco Use   Smoking status: Never   Smokeless tobacco: Never  Vaping Use   Vaping Use: Never used  Substance and Sexual Activity   Alcohol use: Not Currently   Drug use: Never   Sexual activity: Yes    Partners:  Female  Other Topics Concern   Not on file  Social History Narrative   Not on file   Social Determinants of Health   Financial Resource Strain: Not on file  Food Insecurity: Not on file  Transportation Needs: Not on file  Physical Activity: Not on file  Stress: Not on file  Social Connections: Not on file  Intimate Partner Violence: Not on file    Family History  Problem Relation Age of Onset   Diabetes Mother    Alzheimer's disease Father     Past Surgical History:  Procedure Laterality Date   NO PAST SURGERIES      ROS: Review of Systems Negative  except as stated above  PHYSICAL EXAM: BP 118/81 (BP Location: Left Arm, Patient Position: Sitting, Cuff Size: Large)   Pulse 74   Temp 98.4 F (36.9 C)   Resp 18   Ht 5' 10.98" (1.803 m)   Wt 269 lb 3.2 oz (122.1 kg)   SpO2 96%   BMI 37.56 kg/m   Physical Exam HENT:     Head: Normocephalic and atraumatic.  Eyes:     Extraocular Movements: Extraocular movements intact.     Conjunctiva/sclera: Conjunctivae normal.     Pupils: Pupils are equal, round, and reactive to light.  Cardiovascular:     Rate and Rhythm: Normal rate and regular rhythm.     Pulses: Normal pulses.     Heart sounds: Normal heart sounds.  Pulmonary:     Effort: Pulmonary effort is normal.     Breath sounds: Normal breath sounds.  Musculoskeletal:     Cervical back: Normal range of motion and neck supple.  Neurological:     General: No focal deficit present.     Mental Status: He is alert and oriented to person, place, and time.  Psychiatric:        Mood and Affect: Mood normal.        Behavior: Behavior normal.   ASSESSMENT AND PLAN: 1. Type 2 diabetes mellitus without complication, without long-term current use of insulin ([place]): - Continue Metformin 1000 mg twice daily as prescribed. No refills needed as of present.  - Decrease Dapagliflozin Propanediol from 10 mg daily to 5 mg daily related to side effects of diarrhea. - Discussed the importance of healthy eating habits, low-carbohydrate diet, low-sugar diet, regular aerobic exercise (at least 150 minutes a week as tolerated) and medication compliance to achieve or maintain control of diabetes. - Will repeat hemoglobin A1c at next appointment.  - Follow-up with primary provider as scheduled.  2. Dry cough: - Patient declined Covid and flu testing today in office.  - Benzonatate capsules as prescribed.  - Follow-up with primary provider as scheduled. - benzonatate (TESSALON) 100 MG capsule; Take 1 capsule (100 mg total) by mouth 3 (three) times  daily as needed for cough.  Dispense: 30 capsule; Refill: 0    Patient was given the opportunity to ask questions.  Patient verbalized understanding of the plan and was able to repeat key elements of the plan. Patient was given clear instructions to go to Emergency Department or return to medical center if symptoms don't improve, worsen, or new problems develop.The patient verbalized understanding.    Requested Prescriptions   Signed Prescriptions Disp Refills   benzonatate (TESSALON) 100 MG capsule 30 capsule 0    Sig: Take 1 capsule (100 mg total) by mouth 3 (three) times daily as needed for cough.    Follow-up with primary provider as scheduled.  Camillia Herter, NP

## 2021-05-15 ENCOUNTER — Other Ambulatory Visit: Payer: Self-pay

## 2021-05-15 ENCOUNTER — Ambulatory Visit (INDEPENDENT_AMBULATORY_CARE_PROVIDER_SITE_OTHER): Payer: Federal, State, Local not specified - PPO | Admitting: Family

## 2021-05-15 VITALS — BP 118/81 | HR 74 | Temp 98.4°F | Resp 18 | Ht 70.98 in | Wt 269.2 lb

## 2021-05-15 DIAGNOSIS — R058 Other specified cough: Secondary | ICD-10-CM

## 2021-05-15 DIAGNOSIS — E119 Type 2 diabetes mellitus without complications: Secondary | ICD-10-CM

## 2021-05-15 MED ORDER — BENZONATATE 100 MG PO CAPS: 100.0000 mg | ORAL_CAPSULE | Freq: Three times a day (TID) | ORAL | 0 refills | Status: DC | PRN

## 2021-05-15 NOTE — Progress Notes (Signed)
Pt presents for diabetes follow-up, states that the Farxiga increase has caused diarrhea  Pt has complaints of dry cough that started approx 2 weeks ago

## 2021-05-20 ENCOUNTER — Ambulatory Visit: Payer: Self-pay

## 2021-05-20 ENCOUNTER — Encounter: Payer: Self-pay | Admitting: Physical Medicine and Rehabilitation

## 2021-05-20 ENCOUNTER — Other Ambulatory Visit: Payer: Self-pay

## 2021-05-20 ENCOUNTER — Ambulatory Visit (INDEPENDENT_AMBULATORY_CARE_PROVIDER_SITE_OTHER): Payer: Worker's Compensation | Admitting: Physical Medicine and Rehabilitation

## 2021-05-20 DIAGNOSIS — M25511 Pain in right shoulder: Secondary | ICD-10-CM

## 2021-05-20 DIAGNOSIS — G8929 Other chronic pain: Secondary | ICD-10-CM

## 2021-05-20 NOTE — Progress Notes (Signed)
Pt state right shoulder pain. Pt state when rasing his right arm he feels pain. Pt state he goes to PT and takes pain meds to ease his pain.  Numeric Pain Rating Scale and Functional Assessment Average Pain 2   In the last MONTH (on 0-10 scale) has pain interfered with the following?  1. General activity like being  able to carry out your everyday physical activities such as walking, climbing stairs, carrying groceries, or moving a chair?  Rating(6)   -BT, -Dye Allergies.'

## 2021-05-20 NOTE — Progress Notes (Signed)
   Thomas Cummings - 55 y.o. male MRN [id number]  Date of birth: 07/28/1965  Office Visit Note: Visit Date: 05/20/2021 PCP: Rema Fendt, NP Referred by: Rema Fendt, NP  Subjective: Chief Complaint  Patient presents with   Right Shoulder - Pain   HPI:  Thomas Cummings is a 55 y.o. male who comes in today at the request of Dr. Doneen Poisson for planned Right anesthetic glenohumeral arthrogram with fluoroscopic guidance.  The patient has failed conservative care including home exercise, medications, time and activity modification.  This injection will be diagnostic and hopefully therapeutic.  Please see requesting physician notes for further details and justification.   ROS Otherwise per HPI.  Assessment & Plan: Visit Diagnoses:    ICD-10-CM   1. Chronic right shoulder pain  M25.511 XR C-ARM NO REPORT   G89.29 Large Joint Inj: R glenohumeral      Plan: No additional findings.   Meds & Orders: No orders of the defined types were placed in this encounter.   Orders Placed This Encounter  Procedures   Large Joint Inj: R glenohumeral   XR C-ARM NO REPORT    Follow-up: Return for Doneen Poisson, MD as scheduled.   Procedures: Large Joint Inj: R glenohumeral on 05/20/2021 1:11 PM Indications: pain and diagnostic evaluation Details: 22 G 3.5 in needle, fluoroscopy-guided anteromedial approach  Arthrogram: No  Medications: 40 mg triamcinolone acetonide 40 MG/ML; 5 mL bupivacaine 0.25 % Outcome: tolerated well, no immediate complications  There was excellent flow of contrast producing a partial arthrogram of the glenohumeral joint. The patient did have relief of symptoms during the anesthetic phase of the injection. Procedure, treatment alternatives, risks and benefits explained, specific risks discussed. Consent was given by the patient. Immediately prior to procedure a time out was called to verify the correct patient, procedure, equipment, support staff and  site/side marked as required. Patient was prepped and draped in the usual sterile fashion.         Clinical History: No specialty comments available.     Objective:  VS:  HT:    WT:   BMI:     BP:   HR: bpm  TEMP: ( )  RESP:  Physical Exam   Imaging: No results found.

## 2021-05-26 ENCOUNTER — Ambulatory Visit (INDEPENDENT_AMBULATORY_CARE_PROVIDER_SITE_OTHER): Payer: Worker's Compensation | Admitting: Orthopaedic Surgery

## 2021-05-26 ENCOUNTER — Encounter: Payer: Self-pay | Admitting: Orthopaedic Surgery

## 2021-05-26 DIAGNOSIS — Z9889 Other specified postprocedural states: Secondary | ICD-10-CM

## 2021-05-26 NOTE — Progress Notes (Signed)
The patient is now just over 4 months status post a right shoulder arthroscopy with extensive debridement.  He had a partial-thickness rotator cuff tear that was work-related.  He does have his case manager with him today.  He has been participating in outpatient physical therapy.  He is working but at a light duty level.  He reports some progress with the shoulder overall.  6 days ago he did have an intra-articular injection under fluoroscopy in his right shoulder joint by Dr. Alvester Morin.  He said that is helped him some as well.  On exam his range of motion with abduction as well as external rotation is improving.  His internal rotation with adduction is still limited but better than it was before certainly passively.  He will now transition to work conditioning and industrial rehabilitation for his right shoulder.  I will see him back in 4 weeks after keeping him continue light duty for the next 4 weeks.  At that visit we may be able to transition him to an FCE.  All questions and concerns were answered and addressed.

## 2021-06-01 MED ORDER — BUPIVACAINE HCL 0.25 % IJ SOLN
5.0000 mL | INTRAMUSCULAR | Status: AC | PRN
  Administered 2021-05-20: 13:00:00 5 mL via INTRA_ARTICULAR

## 2021-06-01 MED ORDER — TRIAMCINOLONE ACETONIDE 40 MG/ML IJ SUSP
40.0000 mg | INTRAMUSCULAR | Status: AC | PRN
Start: 2021-05-20 — End: 2021-05-20
  Administered 2021-05-20: 13:00:00 40 mg via INTRA_ARTICULAR

## 2021-06-23 ENCOUNTER — Encounter: Payer: Self-pay | Admitting: Orthopaedic Surgery

## 2021-06-23 ENCOUNTER — Other Ambulatory Visit: Payer: Self-pay

## 2021-06-23 ENCOUNTER — Ambulatory Visit (INDEPENDENT_AMBULATORY_CARE_PROVIDER_SITE_OTHER): Payer: Worker's Compensation | Admitting: Orthopaedic Surgery

## 2021-06-23 DIAGNOSIS — Z9889 Other specified postprocedural states: Secondary | ICD-10-CM | POA: Diagnosis not present

## 2021-06-23 NOTE — Progress Notes (Signed)
The patient will be 5 months out from surgery this week from a right shoulder arthroscopy with extensive debridement and subacromial decompression.  Intraoperatively found significant inflamed tissue in the glenohumeral joint and just a partial-thickness rotator cuff tear with no full-thickness.  There is significant tendinosis of the rotator cuff and decreased subacromial outlet which we worked on arthroscopically.  He has been on light duty at work with lifting restrictions.  He has been through physical therapy and more recently through work conditioning and Research scientist (life sciences).  He still reports some weakness in his shoulder and some pain.    On exam he does have just some slight weakness in his right shoulder and pain past 90 degrees of abduction of the right shoulder.  It is well located in his motion passively is almost full at this standpoint.  He has been participating in a work Product manager.  He has had 6 visits.  The therapist from work conditioning his recommended 3 times a week for at least 4 more weeks to improve his overhead lifting capacity in his right upper extremity endurance for hopefully return to full work duty at work.  His case manager is with him.  I gave her a note to have him continue the work conditioning program and to keep him on his current level of light duty work for the next 4 weeks.  We will reevaluate him in 4 weeks and at that point hopefully will be able to get him back to full work duties with no restrictions versus some restrictions and can have a disability rating at some point after that since he will be 6 months out from surgery.

## 2021-07-10 NOTE — Progress Notes (Addendum)
Patient ID: Thomas Cummings, male    DOB: 1966/05/03  MRN: [id number]  CC: Hypertension Follow-Up  Subjective: Thomas Cummings is a 56 y.o. male who presents for hypertension follow-up.   His concerns today include:  HYPERTENSION FOLLOW-UP: 04/17/2021: - Continue Amlodipine, Carvedilol, Hydrochlorothiazide, Lisinopril, and Spironolactone as prescribed.   07/17/2021: Doing well on current regimen. No side effects. No issues/concerns. Denies chest pain and shortness of breath.   2. DIABETES TYPE 2 FOLLOW-UP: 05/15/2021: - Continue Metformin 1000 mg twice daily as prescribed. No refills needed as of present.  - Decrease Dapagliflozin Propanediol from 10 mg daily to 5 mg daily related to side effects of diarrhea.  07/17/2021: Doing well on current regimen, no issues/concerns. Not checking blood sugars at home routinely. Not ready to add medication to regimen. Prefers to trial increase of Wilder Glade again hopeful that will not have side effects of diarrhea.   3. HYPERLIPIDEMIA FOLLOW-UP: Doing well on current regimen, no issues/concerns.  Patient Active Problem List   Diagnosis Date Noted   Traumatic complete tear of right rotator cuff 12/10/2020   Type 2 diabetes mellitus without complication, without long-term current use of insulin ([place]) 04/08/2018   Essential hypertension 04/08/2018   BMI 39.0-39.9,adult 04/08/2018   Testosterone deficiency in male 04/08/2018     Current Outpatient Medications on File Prior to Visit  Medication Sig Dispense Refill   blood glucose meter kit and supplies Dispense based on patient and insurance preference. Use up to four times daily as directed. (FOR ICD-10 E10.9, E11.9). 1 each 0   Misc. Devices MISC 1 each by Does not apply route at bedtime. Please provide patient with insurance approved CPAP. Patient reports he is unsure of settings and mask size. Reports he would check once returning home. 1 each 0   No current facility-administered medications on  file prior to visit.    No Known Allergies  Social History   Socioeconomic History   Marital status: Married    Spouse name: Not on file   Number of children: Not on file   Years of education: Not on file   Highest education level: Not on file  Occupational History   Not on file  Tobacco Use   Smoking status: Never   Smokeless tobacco: Never  Vaping Use   Vaping Use: Never used  Substance and Sexual Activity   Alcohol use: Not Currently   Drug use: Never   Sexual activity: Yes    Partners: Female  Other Topics Concern   Not on file  Social History Narrative   Not on file   Social Determinants of Health   Financial Resource Strain: Not on file  Food Insecurity: Not on file  Transportation Needs: Not on file  Physical Activity: Not on file  Stress: Not on file  Social Connections: Not on file  Intimate Partner Violence: Not on file    Family History  Problem Relation Age of Onset   Diabetes Mother    Alzheimer's disease Father     Past Surgical History:  Procedure Laterality Date   NO PAST SURGERIES      ROS: Review of Systems Negative except as stated above  PHYSICAL EXAM: BP 126/85 (BP Location: Left Arm, Patient Position: Sitting, Cuff Size: Large)    Pulse 77    Temp 98.5 F (36.9 C)    Resp 18    Ht 5' 10.98" (1.803 m)    Wt 270 lb (122.5 kg)    SpO2 98%  BMI 37.67 kg/m   Physical Exam HENT:     Head: Normocephalic and atraumatic.  Eyes:     Extraocular Movements: Extraocular movements intact.     Conjunctiva/sclera: Conjunctivae normal.     Pupils: Pupils are equal, round, and reactive to light.  Cardiovascular:     Rate and Rhythm: Normal rate and regular rhythm.     Pulses: Normal pulses.     Heart sounds: Normal heart sounds.  Pulmonary:     Effort: Pulmonary effort is normal.     Breath sounds: Normal breath sounds.  Musculoskeletal:     Cervical back: Normal range of motion and neck supple.  Neurological:     General: No focal  deficit present.     Mental Status: He is alert and oriented to person, place, and time.  Psychiatric:        Mood and Affect: Mood normal.        Behavior: Behavior normal.   Results for orders placed or performed in visit on 07/17/21  POCT glycosylated hemoglobin (Hb A1C)  Result Value Ref Range   Hemoglobin A1C 8.3 (A) 4.0 - 5.6 %   HbA1c POC (<> result, manual entry)     HbA1c, POC (prediabetic range)     HbA1c, POC (controlled diabetic range)      ASSESSMENT AND PLAN: 1. Essential hypertension: - Continue Amlodipine, Carvedilol, Hydrochlorothiazide, Lisinopril, and Spironolactone as prescribed.  - Counseled on blood pressure goal of less than 130/80, low-sodium, DASH diet, medication compliance, 150 minutes of moderate intensity exercise per week as tolerated. Discussed medication compliance, adverse effects. - Follow-up with primary provider in 3 months or sooner if needed.  - amLODipine (NORVASC) 10 MG tablet; Take 1 tablet (10 mg total) by mouth daily.  Dispense: 90 tablet; Refill: 0 - carvedilol (COREG) 12.5 MG tablet; Take 1 tablet (12.5 mg total) by mouth 2 (two) times daily.  Dispense: 180 tablet; Refill: 0 - hydrochlorothiazide (HYDRODIURIL) 25 MG tablet; Take 1 tablet (25 mg total) by mouth daily.  Dispense: 90 tablet; Refill: 0 - lisinopril (ZESTRIL) 40 MG tablet; Take 1 tablet (40 mg total) by mouth daily.  Dispense: 90 tablet; Refill: 0 - spironolactone (ALDACTONE) 25 MG tablet; Take 1 tablet (25 mg total) by mouth 2 (two) times daily.  Dispense: 180 tablet; Refill: 0  2. Type 2 diabetes mellitus without complication, without long-term current use of insulin ([place]): - Hemoglobin A1c today not at goal at 8.3%, goal < 7%. - Continue Metformin as prescribed. - Increase Dapagliflozin Propanediol from 5 mg daily to 10 mg daily.  - Follow-up with primary provider in 4 weeks for diabetes checkup. Write your home blood sugar results down each day and bring those results to your  appointment along with your home glucose monitor.  - POCT glycosylated hemoglobin (Hb A1C) - metFORMIN (GLUCOPHAGE) 1000 MG tablet; Take 1 tablet (1,000 mg total) by mouth 2 (two) times daily with a meal.  Dispense: 180 tablet; Refill: 0 - dapagliflozin propanediol (FARXIGA) 10 MG TABS tablet; Take 1 tablet (10 mg total) by mouth daily.  Dispense: 30 tablet; Refill: 0  3. Hyperlipidemia associated with type 2 diabetes mellitus ([place]): - Continue Atorvastatin as prescribed.  - Follow-up with primary provider as scheduled. - atorvastatin (LIPITOR) 40 MG tablet; Take 1 tablet (40 mg total) by mouth daily.  Dispense: 90 tablet; Refill: 0   Patient was given the opportunity to ask questions.  Patient verbalized understanding of the plan and was able to repeat  key elements of the plan. Patient was given clear instructions to go to Emergency Department or return to medical center if symptoms don't improve, worsen, or new problems develop.The patient verbalized understanding.   Orders Placed This Encounter  Procedures   POCT glycosylated hemoglobin (Hb A1C)     Requested Prescriptions   Signed Prescriptions Disp Refills   amLODipine (NORVASC) 10 MG tablet 90 tablet 0    Sig: Take 1 tablet (10 mg total) by mouth daily.   carvedilol (COREG) 12.5 MG tablet 180 tablet 0    Sig: Take 1 tablet (12.5 mg total) by mouth 2 (two) times daily.   hydrochlorothiazide (HYDRODIURIL) 25 MG tablet 90 tablet 0    Sig: Take 1 tablet (25 mg total) by mouth daily.   lisinopril (ZESTRIL) 40 MG tablet 90 tablet 0    Sig: Take 1 tablet (40 mg total) by mouth daily.   spironolactone (ALDACTONE) 25 MG tablet 180 tablet 0    Sig: Take 1 tablet (25 mg total) by mouth 2 (two) times daily.   metFORMIN (GLUCOPHAGE) 1000 MG tablet 180 tablet 0    Sig: Take 1 tablet (1,000 mg total) by mouth 2 (two) times daily with a meal.   dapagliflozin propanediol (FARXIGA) 10 MG TABS tablet 30 tablet 0    Sig: Take 1 tablet (10 mg  total) by mouth daily.   atorvastatin (LIPITOR) 40 MG tablet 90 tablet 0    Sig: Take 1 tablet (40 mg total) by mouth daily.    Return in about 3 months (around 10/14/2021) for Follow-Up or next available hypertension, 4 weeks diabetes .  Camillia Herter, NP

## 2021-07-17 ENCOUNTER — Ambulatory Visit: Payer: Federal, State, Local not specified - PPO | Admitting: Family

## 2021-07-17 ENCOUNTER — Other Ambulatory Visit: Payer: Self-pay

## 2021-07-17 VITALS — BP 126/85 | HR 77 | Temp 98.5°F | Resp 18 | Ht 70.98 in | Wt 270.0 lb

## 2021-07-17 DIAGNOSIS — E785 Hyperlipidemia, unspecified: Secondary | ICD-10-CM

## 2021-07-17 DIAGNOSIS — E1169 Type 2 diabetes mellitus with other specified complication: Secondary | ICD-10-CM

## 2021-07-17 DIAGNOSIS — E119 Type 2 diabetes mellitus without complications: Secondary | ICD-10-CM

## 2021-07-17 DIAGNOSIS — I10 Essential (primary) hypertension: Secondary | ICD-10-CM

## 2021-07-17 LAB — POCT GLYCOSYLATED HEMOGLOBIN (HGB A1C): Hemoglobin A1C: 8.3 % — AB (ref 4.0–5.6)

## 2021-07-17 MED ORDER — ATORVASTATIN CALCIUM 40 MG PO TABS: 40.0000 mg | ORAL_TABLET | Freq: Every day | ORAL | 0 refills | Status: DC

## 2021-07-17 MED ORDER — CARVEDILOL 12.5 MG PO TABS: 12.5000 mg | ORAL_TABLET | Freq: Two times a day (BID) | ORAL | 0 refills | Status: DC

## 2021-07-17 MED ORDER — HYDROCHLOROTHIAZIDE 25 MG PO TABS: 25.0000 mg | ORAL_TABLET | Freq: Every day | ORAL | 0 refills | Status: DC

## 2021-07-17 MED ORDER — AMLODIPINE BESYLATE 10 MG PO TABS: 10.0000 mg | ORAL_TABLET | Freq: Every day | ORAL | 0 refills | Status: DC

## 2021-07-17 MED ORDER — SPIRONOLACTONE 25 MG PO TABS: 25.0000 mg | ORAL_TABLET | Freq: Two times a day (BID) | ORAL | 0 refills | Status: DC

## 2021-07-17 MED ORDER — LISINOPRIL 40 MG PO TABS: 40.0000 mg | ORAL_TABLET | Freq: Every day | ORAL | 0 refills | Status: DC

## 2021-07-17 MED ORDER — METFORMIN HCL 1000 MG PO TABS: 1000.0000 mg | ORAL_TABLET | Freq: Two times a day (BID) | ORAL | 0 refills | Status: DC

## 2021-07-17 MED ORDER — DAPAGLIFLOZIN PROPANEDIOL 10 MG PO TABS: 10.0000 mg | ORAL_TABLET | Freq: Every day | ORAL | 0 refills | Status: DC

## 2021-07-17 NOTE — Patient Instructions (Signed)
Sitagliptin Tablets What is this medication? SITAGLIPTIN (sit a GLIP tin) treats type 2 diabetes. It works by increasing insulin levels in your body, which decreases your blood sugar (glucose). It also reduces the amount of sugar released into your blood. Changes to diet and exercise are often combined with this medication. This medicine may be used for other purposes; ask your health care provider or pharmacist if you have questions. COMMON BRAND NAME(S): Januvia What should I tell my care team before I take this medication? They need to know if you have any of these conditions: Diabetic ketoacidosis Kidney disease Pancreatitis Previous swelling of the tongue, face, or lips with difficulty breathing, difficulty swallowing, hoarseness, or tightening of the throat Type 1 diabetes An unusual or allergic reaction to sitagliptin, other medications, foods, dyes, or preservatives Pregnant or trying to get pregnant Breast-feeding How should I use this medication? Take this medication by mouth with a glass of water. Follow the directions on the prescription label. You can take it with or without food. Do not cut, crush or chew this medication. Take your dose at the same time each day. Do not take more often than directed. Do not stop taking except on your care team's advice. A special MedGuide will be given to you by the pharmacist with each prescription and refill. Be sure to read this information carefully each time. Talk to your care team about the use of this medication in children. It is not approved for use in children. Overdosage: If you think you have taken too much of this medicine contact a poison control center or emergency room at once. NOTE: This medicine is only for you. Do not share this medicine with others. What if I miss a dose? If you miss a dose, take it as soon as you can. If it is almost time for your next dose, take only that dose. Do not take double or extra doses. What may  interact with this medication? Do not take this medication with any of the following: Gatifloxacin This medication may also interact with the following: Alcohol Digoxin Insulin Sulfonylureas like glimepiride, glipizide, glyburide This list may not describe all possible interactions. Give your health care provider a list of all the medicines, herbs, non-prescription drugs, or dietary supplements you use. Also tell them if you smoke, drink alcohol, or use illegal drugs. Some items may interact with your medicine. What should I watch for while using this medication? Visit your care team for regular checks on your progress. A test called the HbA1C (A1C) will be monitored. This is a simple blood test. It measures your blood sugar control over the last 2 to 3 months. You will receive this test every 3 to 6 months. Learn how to check your blood sugar. Learn the symptoms of low and high blood sugar and how to manage them. Always carry a quick-source of sugar with you in case you have symptoms of low blood sugar. Examples include hard sugar candy or glucose tablets. Make sure others know that you can choke if you eat or drink when you develop serious symptoms of low blood sugar, such as seizures or unconsciousness. They must get medical help at once. Tell your care team if you have high blood sugar. You might need to change the dose of your medication. If you are sick or exercising more than usual, you might need to change the dose of your medication. Do not skip meals. Ask your care team if you should avoid alcohol. Many   nonprescription cough and cold products contain sugar or alcohol. These can affect blood sugar. Wear a medical ID bracelet or chain, and carry a card that describes your disease and details of your medication and dosage times. What side effects may I notice from receiving this medication? Side effects that you should report to your care team as soon as possible: Allergic reactions--skin  rash, itching, hives, swelling of the face, lips, tongue, or throat Heart failure--shortness of breath, swelling of the ankles, feet, or hands, sudden weight gain, unusual weakness or fatigue Kidney injury--decrease in the amount of urine, swelling of the ankles, hands, or feet Pancreatitis--severe stomach pain that spreads to your back or gets worse after eating or when touched, fever, nausea, vomiting Redness, blistering, peeling or loosening of the skin, including inside the mouth Severe joint pain Side effects that usually do not require medical attention (report to your care team if they continue or are bothersome): Headache Runny or stuffy nose Sore throat This list may not describe all possible side effects. Call your doctor for medical advice about side effects. You may report side effects to FDA at 1-800-FDA-1088. Where should I keep my medication? Keep out of the reach of children. Store at room temperature between 15 and 30 degrees C (59 and 86 degrees F). Throw away any unused medication after the expiration date. NOTE: This sheet is a summary. It may not cover all possible information. If you have questions about this medicine, talk to your doctor, pharmacist, or health care provider.  2022 Elsevier/Gold Standard (2021-02-17 00:00:00)  

## 2021-07-17 NOTE — Progress Notes (Signed)
Pt presents for hypertension and diabetes follow-up  

## 2021-07-17 NOTE — Progress Notes (Signed)
Diabetes discussed in office.

## 2021-07-27 ENCOUNTER — Encounter: Payer: Self-pay | Admitting: Orthopaedic Surgery

## 2021-07-27 ENCOUNTER — Other Ambulatory Visit: Payer: Self-pay

## 2021-07-27 ENCOUNTER — Ambulatory Visit (INDEPENDENT_AMBULATORY_CARE_PROVIDER_SITE_OTHER): Payer: Worker's Compensation | Admitting: Orthopaedic Surgery

## 2021-07-27 DIAGNOSIS — M25511 Pain in right shoulder: Secondary | ICD-10-CM | POA: Diagnosis not present

## 2021-07-27 DIAGNOSIS — Z9889 Other specified postprocedural states: Secondary | ICD-10-CM | POA: Diagnosis not present

## 2021-07-27 NOTE — Progress Notes (Signed)
The patient is now just over 6 months out from a right shoulder arthroscopy where we found extensive inflamed tissue and a partial-thickness rotator cuff tear.  We performed an extensive debridement and subacromial decompression.  He has been through physical therapy and now most recently work conditioning.  The notes from work conditioning say that he can perform a job at a medium physical demand level with lifting occasionally no greater than 50 pounds.  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  He has been working light duty up until now.  There is still some limitations in range of motion of his shoulder but he feels like he is getting there.  On exam actively he does not like to abduct much past 90 degrees of his right shoulder but passively I can get him to 160 degrees.  His external rotation is 80 degrees and his forward flexion is 160 degrees.  His internal rotation with adduction is just above the belt line.  He has 4+ out of 5 strength of the rotator cuff.  His liftoff is negative.  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  A full disability letter in writing will be forthcoming.

## 2021-08-09 NOTE — Progress Notes (Signed)
? ? ?Patient ID: Thomas Cummings, male    DOB: December 29, 1965  MRN: [id number] ? ?CC: Diabetes Follow-Up ? ?Subjective: ?Thomas Cummings is a 56 y.o. male who presents for diabetes follow-up.  ? ?His concerns today include:  ?DIABETES TYPE 2 FOLLOW-UP: ?07/17/2021: ?- Hemoglobin A1c today not at goal at 8.3%, goal < 7%. ?- Continue Metformin as prescribed. ?- Increase Dapagliflozin Propanediol from 5 mg daily to 10 mg daily.  ? ?08/14/2021: ?Doing well on current regimen. No side effects. No issues/concerns. Not checking blood sugars at home, CMA will demonstrate today.  ? ?2. LEFT KNEE SWELLING: ?Persisting for weeks. Began suddenly. Denies trauma/injury, shortness of breath, chest pain, lower leg swelling/tenderness/pain/warmth and additional red flag symptoms.  ? ?3. 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 Was initiated on CPAP related to increase neck circumference. Had current machine for about 3 years. Would like referral for updated sleep study.  ? ?Patient Active Problem List  ? Diagnosis Date Noted  ? Traumatic complete tear of right rotator cuff 12/10/2020  ? Type 2 diabetes mellitus without complication, without long-term current use of insulin ([place]) 04/08/2018  ? Essential hypertension 04/08/2018  ? BMI 39.0-39.9,adult 04/08/2018  ? Testosterone deficiency in male 04/08/2018  ?  ? ?Current Outpatient Medications on File Prior to Visit  ?Medication Sig Dispense Refill  ? amLODipine (NORVASC) 10 MG tablet Take 1 tablet (10 mg total) by mouth daily. 90 tablet 0  ? atorvastatin (LIPITOR) 40 MG tablet Take 1 tablet (40 mg total) by mouth daily. 90 tablet 0  ? blood glucose meter kit and supplies Dispense based on patient and insurance preference. Use up to four times daily as directed. (FOR ICD-10 E10.9, E11.9). 1 each 0  ? carvedilol (COREG) 12.5 MG tablet Take 1 tablet (12.5 mg total) by mouth 2 (two) times daily. 180 tablet 0  ? hydrochlorothiazide (HYDRODIURIL) 25 MG tablet Take 1 tablet (25 mg total) by mouth  daily. 90 tablet 0  ? lisinopril (ZESTRIL) 40 MG tablet Take 1 tablet (40 mg total) by mouth daily. 90 tablet 0  ? metFORMIN (GLUCOPHAGE) 1000 MG tablet Take 1 tablet (1,000 mg total) by mouth 2 (two) times daily with a meal. 180 tablet 0  ? Misc. Devices MISC 1 each by Does not apply route at bedtime. Please provide patient with insurance approved CPAP. Patient reports he is unsure of settings and mask size. Reports he would check once returning home. 1 each 0  ? spironolactone (ALDACTONE) 25 MG tablet Take 1 tablet (25 mg total) by mouth 2 (two) times daily. 180 tablet 0  ? ?No current facility-administered medications on file prior to visit.  ? ? ?No Known Allergies ? ?Social History  ? ?Socioeconomic History  ? Marital status: Married  ?  Spouse name: Not on file  ? Number of children: Not on file  ? Years of education: Not on file  ? Highest education level: Not on file  ?Occupational History  ? Not on file  ?Tobacco Use  ? Smoking status: Never  ?  Passive exposure: Never  ? Smokeless tobacco: Never  ?Vaping Use  ? Vaping Use: Never used  ?Substance and Sexual Activity  ? Alcohol use: Not Currently  ? Drug use: Never  ? Sexual activity: Yes  ?  Partners: Female  ?Other Topics Concern  ? Not on file  ?Social History Narrative  ? Not on file  ? ?Social Determinants of Health  ? ?Financial Resource Strain: Not on file  ?  Food Insecurity: Not on file  ?Transportation Needs: Not on file  ?Physical Activity: Not on file  ?Stress: Not on file  ?Social Connections: Not on file  ?Intimate Partner Violence: Not on file  ? ? ?Family History  ?Problem Relation Age of Onset  ? Diabetes Mother   ? Alzheimer's disease Father   ? ? ?Past Surgical History:  ?Procedure Laterality Date  ? NO PAST SURGERIES    ? ? ?ROS: ?Review of Systems ?Negative except as stated above ? ?PHYSICAL EXAM: ?BP 119/82 (BP Location: Left Arm, Patient Position: Sitting, Cuff Size: Large)   Pulse 83   Temp 98.5 ?F (36.9 ?C)   Resp 18   Ht 5' 10.98"  (1.803 m)   Wt 265 lb (120.2 kg)   SpO2 98%   BMI 36.98 kg/m?  ? ?Physical Exam ?HENT:  ?   Head: Normocephalic and atraumatic.  ?Eyes:  ?   Extraocular Movements: Extraocular movements intact.  ?   Conjunctiva/sclera: Conjunctivae normal.  ?   Pupils: Pupils are equal, round, and reactive to light.  ?Cardiovascular:  ?   Rate and Rhythm: Normal rate and regular rhythm.  ?   Pulses: Normal pulses.  ?   Heart sounds: Normal heart sounds.  ?Pulmonary:  ?   Effort: Pulmonary effort is normal.  ?   Breath sounds: Normal breath sounds.  ?Musculoskeletal:  ?   Cervical back: Normal range of motion and neck supple.  ?   Left knee: Swelling present. Decreased range of motion. Tenderness present.  ?Skin: ?   General: Skin is warm and dry.  ?Neurological:  ?   General: No focal deficit present.  ?   Mental Status: He is alert and oriented to person, place, and time.  ?Psychiatric:     ?   Mood and Affect: Mood normal.     ?   Behavior: Behavior normal.  ? ?ASSESSMENT AND PLAN: ?1. Type 2 diabetes mellitus without complication, without long-term current use of insulin ([place]): ?- Continue Metformin as prescribed. No refills needed as of present.  ?- Continue Dapagliflozin Propanediol as prescribed.  ?- Discussed the importance of healthy eating habits, low-carbohydrate diet, low-sugar diet, regular aerobic exercise (at least 150 minutes a week as tolerated) and medication compliance to achieve or maintain control of diabetes. ?- To achieve an A1C goal of less than or equal to 7.0 percent, a fasting blood sugar of 80 to 130 mg/dL and a postprandial glucose (90 to 120 minutes after a meal) less than 180 mg/dL. In the event of sugars less than 60 mg/dl or greater than 400 mg/dl please notify the clinic ASAP. It is recommended that you undergo annual eye exams and annual foot exams. ?- Follow-up with primary provider in 2 months for repeat hemoglobin A1c or sooner if needed. ?- dapagliflozin propanediol (FARXIGA) 10 MG TABS  tablet; Take 1 tablet (10 mg total) by mouth daily.  Dispense: 60 tablet; Refill: 0 ? ?2. Acute pain of left knee: ?- Screening for gout.  ?- Follow-up with primary provider as scheduled.  ?- Uric Acid ? ?3. CPAP (continuous positive airway pressure) dependence: ?- Referral for updated sleep study.  ?- Follow-up with primary provider as scheduled.  ?- [facility] Sleep Study; Future ? ? ? ?Patient was given the opportunity to ask questions.  Patient verbalized understanding of the plan and was able to repeat key elements of the plan. Patient was given clear instructions to go to Emergency Department or return to medical center  if symptoms don't improve, worsen, or new problems develop.The patient verbalized understanding. ? ? ?Orders Placed This Encounter  ?Procedures  ? Uric Acid  ? [facility] Sleep Study  ? ? ? ?Requested Prescriptions  ? ?Signed Prescriptions Disp Refills  ? dapagliflozin propanediol (FARXIGA) 10 MG TABS tablet 60 tablet 0  ?  Sig: Take 1 tablet (10 mg total) by mouth daily.  ? ? ?Return in about 2 months (around 10/14/2021) for Follow-Up or next available. ? ?Camillia Herter, NP  ?

## 2021-08-14 ENCOUNTER — Other Ambulatory Visit: Payer: Self-pay

## 2021-08-14 ENCOUNTER — Ambulatory Visit: Payer: Federal, State, Local not specified - PPO | Admitting: Family

## 2021-08-14 ENCOUNTER — Encounter: Payer: Self-pay | Admitting: Family

## 2021-08-14 VITALS — BP 119/82 | HR 83 | Temp 98.5°F | Resp 18 | Ht 70.98 in | Wt 265.0 lb

## 2021-08-14 DIAGNOSIS — E119 Type 2 diabetes mellitus without complications: Secondary | ICD-10-CM | POA: Diagnosis not present

## 2021-08-14 DIAGNOSIS — Z9989 Dependence on other enabling machines and devices: Secondary | ICD-10-CM | POA: Diagnosis not present

## 2021-08-14 DIAGNOSIS — M25562 Pain in left knee: Secondary | ICD-10-CM

## 2021-08-14 MED ORDER — DAPAGLIFLOZIN PROPANEDIOL 10 MG PO TABS: 10.0000 mg | ORAL_TABLET | Freq: Every day | ORAL | 0 refills | Status: AC

## 2021-08-14 NOTE — Progress Notes (Signed)
Pt presents for 4 week diabetes follow-up ?Pt complains of left knee edema, states no trauma to knee ?Request referral for sleep study  ?

## 2021-08-15 LAB — URIC ACID: Uric Acid: 5.4 mg/dL (ref 3.8–8.4)

## 2021-08-17 NOTE — Progress Notes (Signed)
Call patient with update.  ? ?Uric acid normal, no gout. Follow-up with primary provider as scheduled.

## 2021-08-28 ENCOUNTER — Telehealth: Payer: Self-pay | Admitting: Family

## 2021-08-28 NOTE — Telephone Encounter (Signed)
Copied from CRM [id number]. Topic: General - Other ?>> Aug 28, 2021 10:04 AM Jaquita Rector A wrote: ?Reason for CRM: Patient called in to inform Ms Zonia Kief that he have not heard anything from the sleep apnea Dr that was suppose to contact him and it is over 2 weeks since he have been waiting. Also asking if the nurse that reached out to him on Monday regarding his labs can call him back at Ph# [phone] ?

## 2021-08-31 NOTE — Telephone Encounter (Signed)
Spoke to pt adv that Sleep center contacted and pending insurance verification before he can be scheduled.  ?

## 2021-09-15 ENCOUNTER — Telehealth: Payer: Self-pay | Admitting: Family

## 2021-09-15 ENCOUNTER — Other Ambulatory Visit: Payer: Self-pay | Admitting: Family

## 2021-09-15 DIAGNOSIS — Z9989 Dependence on other enabling machines and devices: Secondary | ICD-10-CM

## 2021-09-15 NOTE — Telephone Encounter (Signed)
Home sleep test ordered as requested.

## 2021-09-15 NOTE — Telephone Encounter (Signed)
Copied from CRM [id number]. Topic: Referral - Question ?>> Sep 15, 2021  8:32 AM Daphine Deutscher D wrote: ?Reason for CRM: Kia with [facility] sleep study called to say this patient's ins will pay for an in home sleep study but will pay for lab study.  They need a new order put in. ? ?CV [phone] ?

## 2021-10-10 NOTE — Progress Notes (Signed)
? ? ?Patient ID: Thomas Cummings, male    DOB: 09-Sep-1965  MRN: [id number] ? ?CC: Diabetes Follow-Up ? ?Subjective: ?Lawyer Washabaugh is a 56 y.o. male who presents for diabetes follow-up.  ? ?His concerns today include:  ?Diabetes type 2 follow-up: ?08/14/2021: ?- Continue Metformin as prescribed.  ?- Continue Dapagliflozin Propanediol as prescribed.  ? ?10/16/2021: ?Doing well on current regimen, no issues/concerns. Not checking blood sugars at home.  ? ?2. Hypertension follow-up: ?07/17/2021: ?- Continue Amlodipine, Carvedilol, Hydrochlorothiazide, Lisinopril, and Spironolactone as prescribed.  ? ?10/16/2021: ?Doing well on current regimen. No side effects. No issues/concerns. Denies chest pain, shortness of breath, worst headache of life and additional red flag symptoms.  ? ?3. Hyperlipidemia follow-up: ?Doing well on current Atorvastatin, no issues/concerns. ? ?4. 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 Last sleep study 2019 and health insurance will not cover repeat sleep study until 5 years after. Purchased current CPAP out of pocket. Needs new CPAP.  ? ? ?Patient Active Problem List  ? Diagnosis Date Noted  ? Traumatic complete tear of right rotator cuff 12/10/2020  ? Type 2 diabetes mellitus without complication, without long-term current use of insulin ([place]) 04/08/2018  ? Essential hypertension 04/08/2018  ? BMI 39.0-39.9,adult 04/08/2018  ? Testosterone deficiency in male 04/08/2018  ?  ? ?Current Outpatient Medications on File Prior to Visit  ?Medication Sig Dispense Refill  ? blood glucose meter kit and supplies Dispense based on patient and insurance preference. Use up to four times daily as directed. (FOR ICD-10 E10.9, E11.9). 1 each 0  ? Misc. Devices MISC 1 each by Does not apply route at bedtime. Please provide patient with insurance approved CPAP. Patient reports he is unsure of settings and mask size. Reports he would check once returning home. 1 each 0  ? ?No  current facility-administered medications on file prior to visit.  ? ? ?No Known Allergies ? ?Social History  ? ?Socioeconomic History  ? Marital status: Married  ?  Spouse name: Not on file  ? Number of children: Not on file  ? Years of education: Not on file  ? Highest education level: Not on file  ?Occupational History  ? Not on file  ?Tobacco Use  ? Smoking status: Never  ?  Passive exposure: Never  ? Smokeless tobacco: Never  ?Vaping Use  ? Vaping Use: Never used  ?Substance and Sexual Activity  ? Alcohol use: Not Currently  ? Drug use: Never  ? Sexual activity: Yes  ?  Partners: Female  ?Other Topics Concern  ? Not on file  ?Social History Narrative  ? Not on file  ? ?Social Determinants of Health  ? ?Financial Resource Strain: Not on file  ?Food Insecurity: Not on file  ?Transportation Needs: Not on file  ?Physical Activity: Not on file  ?Stress: Not on file  ?Social Connections: Not on file  ?Intimate Partner Violence: Not on file  ? ? ?Family History  ?Problem Relation Age of Onset  ? Diabetes Mother   ? Alzheimer's disease Father   ? ? ?Past Surgical History:  ?Procedure Laterality Date  ? NO PAST SURGERIES    ? ? ?ROS: ?Review of Systems ?Negative except as stated above ? ?PHYSICAL EXAM: ?BP 112/78 (BP Location: Left Arm, Patient Position: Sitting, Cuff Size: Large)   Pulse 77   Temp 98.5 ?F (36.9 ?C)   Resp 18   Ht 5' 10.98" (1.803 m)   Wt 270 lb (  122.5 kg)   SpO2 95%   BMI 37.67 kg/m?  ? ?Physical Exam ?HENT:  ?   Head: Normocephalic and atraumatic.  ?Eyes:  ?   Extraocular Movements: Extraocular movements intact.  ?   Conjunctiva/sclera: Conjunctivae normal.  ?   Pupils: Pupils are equal, round, and reactive to light.  ?Cardiovascular:  ?   Rate and Rhythm: Normal rate and regular rhythm.  ?   Pulses: Normal pulses.  ?   Heart sounds: Normal heart sounds.  ?Pulmonary:  ?   Effort: Pulmonary effort is normal.  ?   Breath sounds: Normal breath sounds.  ?Musculoskeletal:  ?   Cervical back: Normal  range of motion and neck supple. No rigidity.  ?Neurological:  ?   General: No focal deficit present.  ?   Mental Status: He is alert and oriented to person, place, and time.  ?Psychiatric:     ?   Mood and Affect: Mood normal.     ?   Behavior: Behavior normal.  ? ?Results for orders placed or performed in visit on 10/16/21  ?POCT glycosylated hemoglobin (Hb A1C)  ?Result Value Ref Range  ? Hemoglobin A1C 7.9 (A) 4.0 - 5.6 %  ? HbA1c POC (<> result, manual entry)    ? HbA1c, POC (prediabetic range)    ? HbA1c, POC (controlled diabetic range)    ? ? ?ASSESSMENT AND PLAN: ?1. Type 2 diabetes mellitus without complication, without long-term current use of insulin ([place]): ?- Hemoglobin A1c today not at goal at 7.9%, goal < 7%. This is improved from previous 8.3% on 07/17/2021. ?- Continue Metformin and Dapagliflozin Propanediol as prescribed.  ?- Begin Sitagliptin as prescribed. Counseled on medication adherence and adverse effects.  ?- Discussed the importance of healthy eating habits, low-carbohydrate diet, low-sugar diet, regular aerobic exercise (at least 150 minutes a week as tolerated) and medication compliance to achieve or maintain control of diabetes. ?- Follow-up with primary provider in 4 weeks or sooner if needed.  ?- POCT glycosylated hemoglobin (Hb A1C) ?- metFORMIN (GLUCOPHAGE) 1000 MG tablet; Take 1 tablet (1,000 mg total) by mouth 2 (two) times daily with a meal.  Dispense: 60 tablet; Refill: 3 ?- sitaGLIPtin (JANUVIA) 25 MG tablet; Take 1 tablet (25 mg total) by mouth daily.  Dispense: 30 tablet; Refill: 0 ?- dapagliflozin propanediol (FARXIGA) 10 MG TABS tablet; Take 1 tablet (10 mg total) by mouth daily before breakfast.  Dispense: 30 tablet; Refill: 3 ? ?2. Essential (primary) hypertension: ?- Continue Amlodipine, Carvedilol, Hydrochlorothiazide, Lisinopril, and Spironolactone as prescribed.  ?- Counseled on blood pressure goal of less than 130/80, low-sodium, DASH diet, medication compliance, and 150  minutes of moderate intensity exercise per week as tolerated. Counseled on medication adherence and adverse effects. ?- Update BMP. ?- Follow-up with primary provider in 4 months or sooner if needed.  ?- Basic Metabolic Panel ?- amLODipine (NORVASC) 10 MG tablet; Take 1 tablet (10 mg total) by mouth daily.  Dispense: 30 tablet; Refill: 3 ?- carvedilol (COREG) 12.5 MG tablet; Take 1 tablet (12.5 mg total) by mouth 2 (two) times daily.  Dispense: 60 tablet; Refill: 3 ?- hydrochlorothiazide (HYDRODIURIL) 25 MG tablet; Take 1 tablet (25 mg total) by mouth daily.  Dispense: 30 tablet; Refill: 3 ?- lisinopril (ZESTRIL) 40 MG tablet; Take 1 tablet (40 mg total) by mouth daily.  Dispense: 30 tablet; Refill: 3 ?- spironolactone (ALDACTONE) 25 MG tablet; Take 1 tablet (25 mg total) by mouth 2 (two) times daily.  Dispense: 60 tablet;  Refill: 3 ? ?3. Hyperlipidemia associated with type 2 diabetes mellitus ([place]): ?- Continue Atorvastatin as prescribed. ?- Update lipid panel.  ?- Follow-up with primary provider as scheduled.  ?- Lipid Panel ?- atorvastatin (LIPITOR) 40 MG tablet; Take 1 tablet (40 mg total) by mouth daily.  Dispense: 90 tablet; Refill: 1 ? ?4. 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 Last sleep study 2019. Health insurance will not cover repeat sleep study until 2024. Purchased current CPAP out of pocket. Needs new CPAP.  ?- Sending CPAP order to [facility] for assistance.  ? ? ? ?Patient was given the opportunity to ask questions.  Patient verbalized understanding of the plan and was able to repeat key elements of the plan. Patient was given clear instructions to go to Emergency Department or return to medical center if symptoms don't improve, worsen, or new problems develop.The patient verbalized understanding. ? ? ?Orders Placed This Encounter  ?Procedures  ? Basic Metabolic Panel  ? Lipid Panel  ? POCT glycosylated  hemoglobin (Hb A1C)  ? ? ? ?Requested Prescriptions  ? ?Signed Prescriptions Disp Refills  ? amLODipine (NORVASC) 10 MG tablet 30 tablet 3  ?  Sig: Take 1 tablet (10 mg total) by mouth daily.  ? carvedilol

## 2021-10-16 ENCOUNTER — Ambulatory Visit: Payer: Federal, State, Local not specified - PPO | Admitting: Family

## 2021-10-16 ENCOUNTER — Encounter: Payer: Self-pay | Admitting: Family

## 2021-10-16 VITALS — BP 112/78 | HR 77 | Temp 98.5°F | Resp 18 | Ht 70.98 in | Wt 270.0 lb

## 2021-10-16 DIAGNOSIS — I10 Essential (primary) hypertension: Secondary | ICD-10-CM | POA: Diagnosis not present

## 2021-10-16 DIAGNOSIS — E1169 Type 2 diabetes mellitus with other specified complication: Secondary | ICD-10-CM

## 2021-10-16 DIAGNOSIS — G4730 Sleep apnea, unspecified: Secondary | ICD-10-CM | POA: Diagnosis not present

## 2021-10-16 DIAGNOSIS — E785 Hyperlipidemia, unspecified: Secondary | ICD-10-CM

## 2021-10-16 DIAGNOSIS — E119 Type 2 diabetes mellitus without complications: Secondary | ICD-10-CM

## 2021-10-16 LAB — POCT GLYCOSYLATED HEMOGLOBIN (HGB A1C): Hemoglobin A1C: 7.9 % — AB (ref 4.0–5.6)

## 2021-10-16 MED ORDER — SPIRONOLACTONE 25 MG PO TABS: 25.0000 mg | ORAL_TABLET | Freq: Two times a day (BID) | ORAL | 3 refills | Status: DC

## 2021-10-16 MED ORDER — DAPAGLIFLOZIN PROPANEDIOL 10 MG PO TABS: 10.0000 mg | ORAL_TABLET | Freq: Every day | ORAL | 3 refills | Status: AC

## 2021-10-16 MED ORDER — METFORMIN HCL 1000 MG PO TABS: 1000.0000 mg | ORAL_TABLET | Freq: Two times a day (BID) | ORAL | 3 refills | Status: DC

## 2021-10-16 MED ORDER — AMLODIPINE BESYLATE 10 MG PO TABS: 10.0000 mg | ORAL_TABLET | Freq: Every day | ORAL | 3 refills | Status: DC

## 2021-10-16 MED ORDER — CARVEDILOL 12.5 MG PO TABS: 12.5000 mg | ORAL_TABLET | Freq: Two times a day (BID) | ORAL | 3 refills | Status: DC

## 2021-10-16 MED ORDER — LISINOPRIL 40 MG PO TABS: 40.0000 mg | ORAL_TABLET | Freq: Every day | ORAL | 3 refills | Status: DC

## 2021-10-16 MED ORDER — SITAGLIPTIN PHOSPHATE 25 MG PO TABS: 25.0000 mg | ORAL_TABLET | Freq: Every day | ORAL | 0 refills | Status: DC

## 2021-10-16 MED ORDER — HYDROCHLOROTHIAZIDE 25 MG PO TABS: 25.0000 mg | ORAL_TABLET | Freq: Every day | ORAL | 3 refills | Status: DC

## 2021-10-16 MED ORDER — ATORVASTATIN CALCIUM 40 MG PO TABS: 40.0000 mg | ORAL_TABLET | Freq: Every day | ORAL | 1 refills | Status: DC

## 2021-10-16 NOTE — Progress Notes (Signed)
Diabetes discussed in office.

## 2021-10-16 NOTE — Progress Notes (Signed)
Pt presents for diabetes follow up ? ?Pt completed sleep study in 2019 will send study to [facility] to see if pt can receive CPAP machine and supplies  ?

## 2021-10-17 LAB — LIPID PANEL
Chol/HDL Ratio: 3.2 ratio (ref 0.0–5.0)
Cholesterol, Total: 138 mg/dL (ref 100–199)
HDL: 43 mg/dL (ref >39)
LDL Chol Calc (NIH): 79 mg/dL (ref 0–99)
Triglycerides: 84 mg/dL (ref 0–149)
VLDL Cholesterol Cal: 16 mg/dL (ref 5–40)

## 2021-10-17 LAB — BASIC METABOLIC PANEL
BUN/Creatinine Ratio: 15 (ref 9–20)
BUN: 18 mg/dL (ref 6–24)
CO2: 23 mmol/L (ref 20–29)
Calcium: 9.5 mg/dL (ref 8.7–10.2)
Chloride: 99 mmol/L (ref 96–106)
Creatinine, Ser: 1.24 mg/dL (ref 0.76–1.27)
Glucose: 124 mg/dL — ABNORMAL HIGH (ref 70–99)
Potassium: 3.9 mmol/L (ref 3.5–5.2)
Sodium: 137 mmol/L (ref 134–144)
eGFR: 68 mL/min/{1.73_m2} (ref >59)

## 2021-10-17 NOTE — Progress Notes (Signed)
Call patient with update.  ? ?Kidney function and electrolytes normal. ? ?Continue Atorvastatin for cholesterol maintenance.

## 2021-10-26 ENCOUNTER — Telehealth: Payer: Self-pay | Admitting: Family

## 2021-10-26 NOTE — Telephone Encounter (Signed)
Pt is calling back requesting an update.  ? ?Pt is requesting back today.  ?

## 2021-10-26 NOTE — Telephone Encounter (Signed)
Information submitted to [facility] on 10/20/21, pt should allow about 10 business days for contact from [place], according to pt sleep study completed about 4-5 years ago and never received a CPAP machine.  ?

## 2021-10-26 NOTE — Telephone Encounter (Signed)
Copied from CRM [id number]. Topic: General - Other ?>> Oct 26, 2021  9:06 AM Marylen Ponto wrote: ?Reason for CRM: Pt called for update on sleep apnea machine. Cb# [phone] ?

## 2021-10-30 NOTE — Telephone Encounter (Signed)
Pt informed

## 2021-11-05 ENCOUNTER — Telehealth: Payer: Self-pay | Admitting: Family

## 2021-11-05 NOTE — Telephone Encounter (Signed)
Pt was transferred directly and stated he has been Waiting on machine since his sleep test and needs an update because it's been too long now. Please advise and thank you.

## 2021-11-05 NOTE — Progress Notes (Signed)
Patient ID: Thomas Cummings, male    DOB: 20-Dec-1965  MRN: [id number]  CC: Diabetes Follow-Up  Subjective: Thomas Cummings is a 56 y.o. male who presents for diabetes follow-up.   His concerns today include:  Diabetes type 2 follow-up: 10/16/2021: - Hemoglobin A1c today not at goal at 7.9%, goal < 7%. This is improved from previous 8.3% on 07/17/2021. - Continue Metformin and Dapagliflozin Propanediol as prescribed.  - Begin Sitagliptin as prescribed.  11/13/2021: Doing well on current regimen. No side effects. No issues/concerns.   2. CPAP update: Reports has been contacted by [facility] in regards to CPAP and stated that he declined their services due to cost. Patient states he will contact insurance company to see what else can be done and update our office at that time.    Patient Active Problem List   Diagnosis Date Noted   Traumatic complete tear of right rotator cuff 12/10/2020   Type 2 diabetes mellitus without complication, without long-term current use of insulin ([place]) 04/08/2018   Essential hypertension 04/08/2018   BMI 39.0-39.9,adult 04/08/2018   Testosterone deficiency in male 04/08/2018     Current Outpatient Medications on File Prior to Visit  Medication Sig Dispense Refill   amLODipine (NORVASC) 10 MG tablet Take 1 tablet (10 mg total) by mouth daily. 30 tablet 3   atorvastatin (LIPITOR) 40 MG tablet Take 1 tablet (40 mg total) by mouth daily. 90 tablet 1   blood glucose meter kit and supplies Dispense based on patient and insurance preference. Use up to four times daily as directed. (FOR ICD-10 E10.9, E11.9). 1 each 0   carvedilol (COREG) 12.5 MG tablet Take 1 tablet (12.5 mg total) by mouth 2 (two) times daily. 60 tablet 3   dapagliflozin propanediol (FARXIGA) 10 MG TABS tablet Take 1 tablet (10 mg total) by mouth daily before breakfast. 30 tablet 3   hydrochlorothiazide (HYDRODIURIL) 25 MG tablet Take 1 tablet (25 mg total) by mouth daily. 30 tablet 3   lisinopril  (ZESTRIL) 40 MG tablet Take 1 tablet (40 mg total) by mouth daily. 30 tablet 3   metFORMIN (GLUCOPHAGE) 1000 MG tablet Take 1 tablet (1,000 mg total) by mouth 2 (two) times daily with a meal. 60 tablet 3   Misc. Devices MISC 1 each by Does not apply route at bedtime. Please provide patient with insurance approved CPAP. Patient reports he is unsure of settings and mask size. Reports he would check once returning home. 1 each 0   spironolactone (ALDACTONE) 25 MG tablet Take 1 tablet (25 mg total) by mouth 2 (two) times daily. 60 tablet 3   No current facility-administered medications on file prior to visit.    No Known Allergies  Social History   Socioeconomic History   Marital status: Married    Spouse name: Not on file   Number of children: Not on file   Years of education: Not on file   Highest education level: Not on file  Occupational History   Not on file  Tobacco Use   Smoking status: Never    Passive exposure: Never   Smokeless tobacco: Never  Vaping Use   Vaping Use: Never used  Substance and Sexual Activity   Alcohol use: Not Currently   Drug use: Never   Sexual activity: Yes    Partners: Female  Other Topics Concern   Not on file  Social History Narrative   Not on file   Social Determinants of Health   Financial Resource  Strain: Not on file  Food Insecurity: Not on file  Transportation Needs: Not on file  Physical Activity: Not on file  Stress: Not on file  Social Connections: Not on file  Intimate Partner Violence: Not on file    Family History  Problem Relation Age of Onset   Diabetes Mother    Alzheimer's disease Father     Past Surgical History:  Procedure Laterality Date   NO PAST SURGERIES      ROS: Review of Systems Negative except as stated above  PHYSICAL EXAM: BP 120/81 (BP Location: Left Arm, Patient Position: Sitting, Cuff Size: Large)   Pulse 79   Temp 98.3 F (36.8 C)   Resp 18   Ht 5' 10.98" (1.803 m)   Wt 268 lb (121.6 kg)    SpO2 97%   BMI 37.40 kg/m   Physical Exam HENT:     Head: Normocephalic and atraumatic.  Eyes:     Extraocular Movements: Extraocular movements intact.     Conjunctiva/sclera: Conjunctivae normal.     Pupils: Pupils are equal, round, and reactive to light.  Cardiovascular:     Rate and Rhythm: Normal rate and regular rhythm.     Pulses: Normal pulses.     Heart sounds: Normal heart sounds.  Pulmonary:     Effort: Pulmonary effort is normal.     Breath sounds: Normal breath sounds.  Musculoskeletal:     Cervical back: Normal range of motion and neck supple.  Neurological:     General: No focal deficit present.     Mental Status: He is alert and oriented to person, place, and time.  Psychiatric:        Mood and Affect: Mood normal.        Behavior: Behavior normal.    ASSESSMENT AND PLAN: 1. Type 2 diabetes mellitus without complication, without long-term current use of insulin ([place]): - Continue Metformin and Dapagliflozin Propanediol as prescribed. No refills needed as of present.  - Continue Sitagliptin as prescribed. - Follow-up with primary provider around September 2023 or sooner if needed. - sitaGLIPtin (JANUVIA) 25 MG tablet; Take 1 tablet (25 mg total) by mouth daily.  Dispense: 90 tablet; Refill: 0  2. Obstructive sleep apnea: 3. CPAP (continuous positive airway pressure) dependence: - Patient reports has been contacted by [facility] in regards to CPAP and stated that he declined their services due to financial concerns. Patient reports he will contact insurance company to see what else can be done and update our office at that time.   Patient was given the opportunity to ask questions.  Patient verbalized understanding of the plan and was able to repeat key elements of the plan. Patient was given clear instructions to go to Emergency Department or return to medical center if symptoms don't improve, worsen, or new problems develop.The patient verbalized  understanding.    Requested Prescriptions   Signed Prescriptions Disp Refills   sitaGLIPtin (JANUVIA) 25 MG tablet 90 tablet 0    Sig: Take 1 tablet (25 mg total) by mouth daily.    Return in about 8 weeks (around 01/08/2022) for Follow-Up or next available DM .  Camillia Herter, NP

## 2021-11-13 ENCOUNTER — Encounter: Payer: Self-pay | Admitting: Family

## 2021-11-13 ENCOUNTER — Ambulatory Visit: Payer: Federal, State, Local not specified - PPO | Admitting: Family

## 2021-11-13 VITALS — BP 120/81 | HR 79 | Temp 98.3°F | Resp 18 | Ht 70.98 in | Wt 268.0 lb

## 2021-11-13 DIAGNOSIS — E119 Type 2 diabetes mellitus without complications: Secondary | ICD-10-CM | POA: Diagnosis not present

## 2021-11-13 DIAGNOSIS — G4733 Obstructive sleep apnea (adult) (pediatric): Secondary | ICD-10-CM | POA: Diagnosis not present

## 2021-11-13 DIAGNOSIS — Z9989 Dependence on other enabling machines and devices: Secondary | ICD-10-CM | POA: Diagnosis not present

## 2021-11-13 MED ORDER — SITAGLIPTIN PHOSPHATE 25 MG PO TABS: 25.0000 mg | ORAL_TABLET | Freq: Every day | ORAL | 0 refills | Status: DC

## 2021-11-13 NOTE — Progress Notes (Signed)
Pt presents for diabetes follow-up Pt haas been contacted by [facility] in regards to CPAP and stated that he declined their services due to cost. Pt states he will contact insurance company to see what else can be done

## 2022-02-02 NOTE — Progress Notes (Deleted)
  Patient ID: Thomas Cummings, male    DOB: 09/09/1965  MRN: [id number]  CC: Chronic Care Management   Subjective: Thomas Cummings is a 55 y.o. male who presents for chronic care management.   His concerns today include:  HTN - Amlodipine, Carvedilol, Hydrochlorothiazide, Lisinopril, and Spironolactone  DM - Metformin and Dapagliflozin Propanediol,  Sitagliptin  HLD - Atorvastatin    Patient Active Problem List   Diagnosis Date Noted   Traumatic complete tear of right rotator cuff 12/10/2020   Type 2 diabetes mellitus without complication, without long-term current use of insulin ([facility]) 04/08/2018   Essential hypertension 04/08/2018   BMI 39.0-39.9,adult 04/08/2018   Testosterone deficiency in male 04/08/2018     Current Outpatient Medications on File Prior to Visit  Medication Sig Dispense Refill   amLODipine (NORVASC) 10 MG tablet Take 1 tablet (10 mg total) by mouth daily. 30 tablet 3   atorvastatin (LIPITOR) 40 MG tablet Take 1 tablet (40 mg total) by mouth daily. 90 tablet 1   blood glucose meter kit and supplies Dispense based on patient and insurance preference. Use up to four times daily as directed. (FOR ICD-10 E10.9, E11.9). 1 each 0   carvedilol (COREG) 12.5 MG tablet Take 1 tablet (12.5 mg total) by mouth 2 (two) times daily. 60 tablet 3   dapagliflozin propanediol (FARXIGA) 10 MG TABS tablet Take 1 tablet (10 mg total) by mouth daily before breakfast. 30 tablet 3   hydrochlorothiazide (HYDRODIURIL) 25 MG tablet Take 1 tablet (25 mg total) by mouth daily. 30 tablet 3   lisinopril (ZESTRIL) 40 MG tablet Take 1 tablet (40 mg total) by mouth daily. 30 tablet 3   metFORMIN (GLUCOPHAGE) 1000 MG tablet Take 1 tablet (1,000 mg total) by mouth 2 (two) times daily with a meal. 60 tablet 3   sitaGLIPtin (JANUVIA) 25 MG tablet Take 1 tablet (25 mg total) by mouth daily. 90 tablet 0   spironolactone (ALDACTONE) 25 MG tablet Take 1 tablet (25 mg total) by mouth 2 (two) times daily. 60  tablet 3   No current facility-administered medications on file prior to visit.    No Known Allergies  Social History   Socioeconomic History   Marital status: Married    Spouse name: Not on file   Number of children: Not on file   Years of education: Not on file   Highest education level: Not on file  Occupational History   Not on file  Tobacco Use   Smoking status: Never    Passive exposure: Never   Smokeless tobacco: Never  Vaping Use   Vaping Use: Never used  Substance and Sexual Activity   Alcohol use: Not Currently   Drug use: Never   Sexual activity: Yes    Partners: Female  Other Topics Concern   Not on file  Social History Narrative   Not on file   Social Determinants of Health   Financial Resource Strain: Not on file  Food Insecurity: Not on file  Transportation Needs: Not on file  Physical Activity: Not on file  Stress: Not on file  Social Connections: Not on file  Intimate Partner Violence: Not on file    Family History  Problem Relation Age of Onset   Diabetes Mother    Alzheimer's disease Father     Past Surgical History:  Procedure Laterality Date   NO PAST SURGERIES      ROS: Review of Systems Negative except as stated above  PHYSICAL EXAM: There   were no vitals taken for this visit.  Physical Exam  {male adult master:310786} {male adult master:310785}     Latest Ref Rng & Units 10/16/2021    3:16 PM 04/17/2021    3:10 PM 12/16/2020    4:53 PM  CMP  Glucose 70 - 99 mg/dL 124  119  117   BUN 6 - 24 mg/dL 18  19  20   Creatinine 0.76 - 1.27 mg/dL 1.24  1.13  1.15   Sodium 134 - 144 mmol/L 137  140  138   Potassium 3.5 - 5.2 mmol/L 3.9  4.2  4.3   Chloride 96 - 106 mmol/L 99  102  100   CO2 20 - 29 mmol/L 23  23  20   Calcium 8.7 - 10.2 mg/dL 9.5  9.7  9.9    Lipid Panel     Component Value Date/Time   CHOL 138 10/16/2021 1516   CHOL 159 10/25/2017 0000   TRIG 84 10/16/2021 1516   TRIG 100 10/25/2017 0000   HDL 43  10/16/2021 1516   CHOLHDL 3.2 10/16/2021 1516   CHOLHDL 3.5 10/25/2017 0000   LDLCALC 79 10/16/2021 1516    CBC    Component Value Date/Time   WBC 9.0 05/21/2019 1555   RBC 4.82 05/21/2019 1555   HGB 15.0 05/21/2019 1555   HCT 43.9 05/21/2019 1555   PLT 309 05/21/2019 1555   MCV 91 05/21/2019 1555   MCH 31.1 05/21/2019 1555   MCHC 34.2 05/21/2019 1555   RDW 13.0 05/21/2019 1555    ASSESSMENT AND PLAN:  There are no diagnoses linked to this encounter.   Patient was given the opportunity to ask questions.  Patient verbalized understanding of the plan and was able to repeat key elements of the plan. Patient was given clear instructions to go to Emergency Department or return to medical center if symptoms don't improve, worsen, or new problems develop.The patient verbalized understanding.   No orders of the defined types were placed in this encounter.    Requested Prescriptions    No prescriptions requested or ordered in this encounter    No follow-ups on file.  Amy J Stephens, NP  

## 2022-02-12 ENCOUNTER — Ambulatory Visit: Payer: Federal, State, Local not specified - PPO | Admitting: Family

## 2022-02-12 DIAGNOSIS — I10 Essential (primary) hypertension: Secondary | ICD-10-CM

## 2022-02-12 DIAGNOSIS — E1169 Type 2 diabetes mellitus with other specified complication: Secondary | ICD-10-CM

## 2022-02-12 DIAGNOSIS — E119 Type 2 diabetes mellitus without complications: Secondary | ICD-10-CM

## 2022-02-22 NOTE — Progress Notes (Signed)
Erroneous encounter-disregard

## 2022-03-08 ENCOUNTER — Encounter: Payer: Federal, State, Local not specified - PPO | Admitting: Family

## 2022-03-08 DIAGNOSIS — E1169 Type 2 diabetes mellitus with other specified complication: Secondary | ICD-10-CM

## 2022-03-08 DIAGNOSIS — E119 Type 2 diabetes mellitus without complications: Secondary | ICD-10-CM

## 2022-03-08 DIAGNOSIS — I10 Essential (primary) hypertension: Secondary | ICD-10-CM

## 2022-05-31 NOTE — Progress Notes (Deleted)
Patient ID: Thomas Cummings, male    DOB: [birth year]  MRN: [id number]  CC: Chronic Care Management  Subjective: [name] is a 56 y.o. male who presents for chronic care management.   His concerns today include:  DM -  Metformin, Dapagliflozin Propanediol, Sitagliptin HTN - Amlodipine, Carvedilol, Hydrochlorothiazide, Lisinopril, Spironolactone  HLD - Atorvastatin   Patient Active Problem List   Diagnosis Date Noted   Traumatic complete tear of right rotator cuff 12/10/2020   Type 2 diabetes mellitus without complication, without long-term current use of insulin ([place]) 04/08/2018   Essential hypertension 04/08/2018   BMI 39.0-39.9,adult 04/08/2018   Testosterone deficiency in male 04/08/2018     Current Outpatient Medications on File Prior to Visit  Medication Sig Dispense Refill   amLODipine (NORVASC) 10 MG tablet Take 1 tablet (10 mg total) by mouth daily. 30 tablet 3   atorvastatin (LIPITOR) 40 MG tablet Take 1 tablet (40 mg total) by mouth daily. 90 tablet 1   blood glucose meter kit and supplies Dispense based on patient and insurance preference. Use up to four times daily as directed. (FOR ICD-10 E10.9, E11.9). 1 each 0   carvedilol (COREG) 12.5 MG tablet Take 1 tablet (12.5 mg total) by mouth 2 (two) times daily. 60 tablet 3   hydrochlorothiazide (HYDRODIURIL) 25 MG tablet Take 1 tablet (25 mg total) by mouth daily. 30 tablet 3   lisinopril (ZESTRIL) 40 MG tablet Take 1 tablet (40 mg total) by mouth daily. 30 tablet 3   metFORMIN (GLUCOPHAGE) 1000 MG tablet Take 1 tablet (1,000 mg total) by mouth 2 (two) times daily with a meal. 60 tablet 3   sitaGLIPtin (JANUVIA) 25 MG tablet Take 1 tablet (25 mg total) by mouth daily. 90 tablet 0   spironolactone (ALDACTONE) 25 MG tablet Take 1 tablet (25 mg total) by mouth 2 (two) times daily. 60 tablet 3   No current facility-administered medications on file prior to visit.    No Known Allergies  Social History    Socioeconomic History   Marital status: Married    Spouse name: Not on file   Number of children: Not on file   Years of education: Not on file   Highest education level: Not on file  Occupational History   Not on file  Tobacco Use   Smoking status: Never    Passive exposure: Never   Smokeless tobacco: Never  Vaping Use   Vaping Use: Never used  Substance and Sexual Activity   Alcohol use: Not Currently   Drug use: Never   Sexual activity: Yes    Partners: Female  Other Topics Concern   Not on file  Social History Narrative   Not on file   Social Determinants of Health   Financial Resource Strain: Not on file  Food Insecurity: Not on file  Transportation Needs: Not on file  Physical Activity: Not on file  Stress: Not on file  Social Connections: Not on file  Intimate Partner Violence: Not on file    Family History  Problem Relation Age of Onset   Diabetes Mother    Alzheimer's disease Father     Past Surgical History:  Procedure Laterality Date   NO PAST SURGERIES      ROS: Review of Systems Negative except as stated above  PHYSICAL EXAM: There were no vitals taken for this visit.  Physical Exam  {male adult master:310786} {male adult master:310785}     Latest Ref Rng & Units 10/16/2021  3:16 PM 04/17/2021    3:10 PM 12/16/2020    4:53 PM  CMP  Glucose 70 - 99 mg/dL 124  119  117   BUN 6 - 24 mg/dL _0 Creatinine 0.76 - 1.27 mg/dL 1.24  1.13  1.15   Sodium 134 - 144 mmol/L 137  140  138   Potassium 3.5 - 5.2 mmol/L 3.9  4.2  4.3   Chloride 96 - 106 mmol/L 99  102  100   CO2 20 - 29 mmol/L _1 Calcium 8.7 - 10.2 mg/dL 9.5  9.7  9.9    Lipid Panel     Component Value Date/Time   CHOL 138 10/16/2021 1516   CHOL 159 10/25/2017 0000   TRIG 84 10/16/2021 1516   TRIG 100 10/25/2017 0000   HDL 43 10/16/2021 1516   CHOLHDL 3.2 10/16/2021 1516   CHOLHDL 3.5 10/25/2017 0000   LDLCALC 79 10/16/2021 1516    CBC     Component Value Date/Time   WBC 9.0 05/21/2019 1555   RBC 4.82 05/21/2019 1555   HGB 15.0 05/21/2019 1555   HCT 43.9 05/21/2019 1555   PLT 309 05/21/2019 1555   MCV 91 05/21/2019 1555   MCH 31.1 05/21/2019 1555   MCHC 34.2 05/21/2019 1555   RDW 13.0 05/21/2019 1555    ASSESSMENT AND PLAN:  There are no diagnoses linked to this encounter.   Patient was given the opportunity to ask questions.  Patient verbalized understanding of the plan and was able to repeat key elements of the plan. Patient was given clear instructions to go to Emergency Department or return to medical center if symptoms don't improve, worsen, or new problems develop.The patient verbalized understanding.   No orders of the defined types were placed in this encounter.    Requested Prescriptions    No prescriptions requested or ordered in this encounter    No follow-ups on file.  Camillia Herter, NP

## 2022-06-01 ENCOUNTER — Other Ambulatory Visit (INDEPENDENT_AMBULATORY_CARE_PROVIDER_SITE_OTHER): Payer: Federal, State, Local not specified - PPO

## 2022-06-01 DIAGNOSIS — I10 Essential (primary) hypertension: Secondary | ICD-10-CM

## 2022-06-01 DIAGNOSIS — E119 Type 2 diabetes mellitus without complications: Secondary | ICD-10-CM

## 2022-06-01 DIAGNOSIS — E1169 Type 2 diabetes mellitus with other specified complication: Secondary | ICD-10-CM

## 2022-06-01 LAB — POCT GLYCOSYLATED HEMOGLOBIN (HGB A1C): Hemoglobin A1C: 7.9 % — AB (ref 4.0–5.6)

## 2022-06-01 MED ORDER — METFORMIN HCL 1000 MG PO TABS: 1000.0000 mg | ORAL_TABLET | Freq: Two times a day (BID) | ORAL | 3 refills | Status: DC

## 2022-06-01 MED ORDER — CARVEDILOL 12.5 MG PO TABS: 12.5000 mg | ORAL_TABLET | Freq: Two times a day (BID) | ORAL | 3 refills | Status: DC

## 2022-06-01 MED ORDER — SITAGLIPTIN PHOSPHATE 25 MG PO TABS: 25.0000 mg | ORAL_TABLET | Freq: Every day | ORAL | 0 refills | Status: DC

## 2022-06-01 MED ORDER — LISINOPRIL 40 MG PO TABS: 40.0000 mg | ORAL_TABLET | Freq: Every day | ORAL | 3 refills | Status: DC

## 2022-06-01 MED ORDER — HYDROCHLOROTHIAZIDE 25 MG PO TABS: 25.0000 mg | ORAL_TABLET | Freq: Every day | ORAL | 3 refills | Status: DC

## 2022-06-01 MED ORDER — AMLODIPINE BESYLATE 10 MG PO TABS: 10.0000 mg | ORAL_TABLET | Freq: Every day | ORAL | 3 refills | Status: DC

## 2022-06-01 MED ORDER — SPIRONOLACTONE 25 MG PO TABS: 25.0000 mg | ORAL_TABLET | Freq: Two times a day (BID) | ORAL | 3 refills | Status: DC

## 2022-06-01 MED ORDER — ATORVASTATIN CALCIUM 40 MG PO TABS: 40.0000 mg | ORAL_TABLET | Freq: Every day | ORAL | 1 refills | Status: DC

## 2022-06-02 ENCOUNTER — Other Ambulatory Visit: Payer: Self-pay | Admitting: Family

## 2022-06-02 ENCOUNTER — Other Ambulatory Visit: Payer: Federal, State, Local not specified - PPO

## 2022-06-02 DIAGNOSIS — E119 Type 2 diabetes mellitus without complications: Secondary | ICD-10-CM

## 2022-06-02 DIAGNOSIS — I10 Essential (primary) hypertension: Secondary | ICD-10-CM

## 2022-06-02 DIAGNOSIS — E1169 Type 2 diabetes mellitus with other specified complication: Secondary | ICD-10-CM

## 2022-06-02 LAB — BASIC METABOLIC PANEL
BUN/Creatinine Ratio: 12 (ref 9–20)
BUN: 14 mg/dL (ref 6–24)
CO2: 20 mmol/L (ref 20–29)
Calcium: 9.3 mg/dL (ref 8.7–10.2)
Chloride: 106 mmol/L (ref 96–106)
Creatinine, Ser: 1.14 mg/dL (ref 0.76–1.27)
Glucose: 174 mg/dL — ABNORMAL HIGH (ref 70–99)
Potassium: 4.3 mmol/L (ref 3.5–5.2)
Sodium: 143 mmol/L (ref 134–144)
eGFR: 75 mL/min/{1.73_m2} (ref >59)

## 2022-06-02 MED ORDER — SITAGLIPTIN PHOSPHATE 50 MG PO TABS: 50.0000 mg | ORAL_TABLET | Freq: Every day | ORAL | 2 refills | Status: DC

## 2022-06-02 MED ORDER — DAPAGLIFLOZIN PROPANEDIOL 10 MG PO TABS: 10.0000 mg | ORAL_TABLET | Freq: Every day | ORAL | 2 refills | Status: DC

## 2022-06-10 ENCOUNTER — Ambulatory Visit: Payer: Federal, State, Local not specified - PPO | Admitting: Family Medicine

## 2022-07-06 NOTE — Progress Notes (Unsigned)
Patient ID: Tobin Witucki, male    DOB: May 05, 1966  MRN: [id number]  CC: Chronic Care Management   Subjective: Scotti Kosta is a 57 y.o. male who presents for chronic care management.   His concerns today include:  Doing well on chronic care medications, no issues/concerns. Denies red flag symptoms. He is trying to decrease amount of soda he consumes.  Patient Active Problem List   Diagnosis Date Noted   Traumatic complete tear of right rotator cuff 12/10/2020   Type 2 diabetes mellitus without complication, without long-term current use of insulin ([place]) 04/08/2018   Essential hypertension 04/08/2018   BMI 39.0-39.9,adult 04/08/2018   Testosterone deficiency in male 04/08/2018     Current Outpatient Medications on File Prior to Visit  Medication Sig Dispense Refill   amLODipine (NORVASC) 10 MG tablet Take 1 tablet (10 mg total) by mouth daily. 30 tablet 3   atorvastatin (LIPITOR) 40 MG tablet Take 1 tablet (40 mg total) by mouth daily. 90 tablet 1   blood glucose meter kit and supplies Dispense based on patient and insurance preference. Use up to four times daily as directed. (FOR ICD-10 E10.9, E11.9). 1 each 0   carvedilol (COREG) 12.5 MG tablet Take 1 tablet (12.5 mg total) by mouth 2 (two) times daily. 60 tablet 3   dapagliflozin propanediol (FARXIGA) 10 MG TABS tablet Take 1 tablet (10 mg total) by mouth daily before breakfast. 30 tablet 2   hydrochlorothiazide (HYDRODIURIL) 25 MG tablet Take 1 tablet (25 mg total) by mouth daily. 30 tablet 3   lisinopril (ZESTRIL) 40 MG tablet Take 1 tablet (40 mg total) by mouth daily. 30 tablet 3   metFORMIN (GLUCOPHAGE) 1000 MG tablet Take 1 tablet (1,000 mg total) by mouth 2 (two) times daily with a meal. 60 tablet 3   spironolactone (ALDACTONE) 25 MG tablet Take 1 tablet (25 mg total) by mouth 2 (two) times daily. 60 tablet 3   No current facility-administered medications on file prior to visit.    No Known Allergies  Social  History   Socioeconomic History   Marital status: Married    Spouse name: Not on file   Number of children: Not on file   Years of education: Not on file   Highest education level: Not on file  Occupational History   Not on file  Tobacco Use   Smoking status: Never    Passive exposure: Never   Smokeless tobacco: Never  Vaping Use   Vaping Use: Never used  Substance and Sexual Activity   Alcohol use: Not Currently   Drug use: Never   Sexual activity: Yes    Partners: Female  Other Topics Concern   Not on file  Social History Narrative   Not on file   Social Determinants of Health   Financial Resource Strain: Not on file  Food Insecurity: Not on file  Transportation Needs: Not on file  Physical Activity: Not on file  Stress: Not on file  Social Connections: Not on file  Intimate Partner Violence: Not on file    Family History  Problem Relation Age of Onset   Diabetes Mother    Alzheimer's disease Father     Past Surgical History:  Procedure Laterality Date   NO PAST SURGERIES      ROS: Review of Systems Negative except as stated above  PHYSICAL EXAM: BP 124/85 (BP Location: Left Arm, Patient Position: Sitting, Cuff Size: Large)   Pulse 78   Temp 98.3 F (  36.8 C)   Resp 16   Ht 5' 10.98" (1.803 m)   Wt 270 lb (122.5 kg)   SpO2 96%   BMI 37.67 kg/m   Physical Exam HENT:     Head: Normocephalic and atraumatic.  Eyes:     Extraocular Movements: Extraocular movements intact.     Conjunctiva/sclera: Conjunctivae normal.     Pupils: Pupils are equal, round, and reactive to light.  Cardiovascular:     Rate and Rhythm: Normal rate and regular rhythm.     Pulses: Normal pulses.     Heart sounds: Normal heart sounds.  Pulmonary:     Effort: Pulmonary effort is normal.     Breath sounds: Normal breath sounds.  Musculoskeletal:     Cervical back: Normal range of motion and neck supple.  Neurological:     General: No focal deficit present.     Mental  Status: He is alert and oriented to person, place, and time.  Psychiatric:        Mood and Affect: Mood normal.        Behavior: Behavior normal.    Results for orders placed or performed in visit on 07/07/22  POCT glycosylated hemoglobin (Hb A1C)  Result Value Ref Range   Hemoglobin A1C 7.8 (A) 4.0 - 5.6 %   HbA1c POC (<> result, manual entry)     HbA1c, POC (prediabetic range)     HbA1c, POC (controlled diabetic range)       ASSESSMENT AND PLAN: 1. Primary hypertension - Continue Amlodipine, Carvedilol, Hydrochlorothiazide, Lisinopril, and Spironolactone  as prescribed. No refills needed as of present.  - Counseled on blood pressure goal of less than 130/80, low-sodium, DASH diet, medication compliance, and 150 minutes of moderate intensity exercise per week as tolerated. Counseled on medication adherence and adverse effects. - Follow-up with primary provider in 3 months or sooner if needed.   2. Type 2 diabetes mellitus without complication, without long-term current use of insulin (HCC) - Hemoglobin A1c not at goal at 7.8%, goal 7%. This is similar to previous 7.9%. - Continue Metformin and Dapagliflozin Propanediol as prescribed. No refills needed as of present.  - Increase Sitagliptin from 50 mg daily to 100 mg daily.  - Discussed the importance of healthy eating habits, low-carbohydrate diet, low-sugar diet, regular aerobic exercise (at least 150 minutes a week as tolerated) and medication compliance to achieve or maintain control of diabetes. - Follow-up with primary provider in 4 weeks or sooner if needed.  - POCT glycosylated hemoglobin (Hb A1C) - sitaGLIPtin (JANUVIA) 100 MG tablet; Take 1 tablet (100 mg total) by mouth daily.  Dispense: 30 tablet; Refill: 2  3. Hyperlipidemia associated with type 2 diabetes mellitus (HCC) - Continue Atorvastatin as prescribed. No refills needed as of present.  - Follow-up with primary provider in 3 months or sooner if needed.     Patient was given the opportunity to ask questions.  Patient verbalized understanding of the plan and was able to repeat key elements of the plan. Patient was given clear instructions to go to Emergency Department or return to medical center if symptoms don't improve, worsen, or new problems develop.The patient verbalized understanding.   Orders Placed This Encounter  Procedures   POCT glycosylated hemoglobin (Hb A1C)     Requested Prescriptions   Signed Prescriptions Disp Refills   sitaGLIPtin (JANUVIA) 100 MG tablet 30 tablet 2    Sig: Take 1 tablet (100 mg total) by mouth daily.    Return  in about 4 weeks (around 08/04/2022) for Follow-Up or next available chronic care mgmt .  Camillia Herter, NP

## 2022-07-07 ENCOUNTER — Ambulatory Visit: Payer: Federal, State, Local not specified - PPO | Admitting: Family

## 2022-07-07 VITALS — BP 124/85 | HR 78 | Temp 98.3°F | Resp 16 | Ht 70.98 in | Wt 270.0 lb

## 2022-07-07 DIAGNOSIS — E1169 Type 2 diabetes mellitus with other specified complication: Secondary | ICD-10-CM | POA: Diagnosis not present

## 2022-07-07 DIAGNOSIS — E785 Hyperlipidemia, unspecified: Secondary | ICD-10-CM | POA: Diagnosis not present

## 2022-07-07 DIAGNOSIS — E119 Type 2 diabetes mellitus without complications: Secondary | ICD-10-CM | POA: Diagnosis not present

## 2022-07-07 DIAGNOSIS — I10 Essential (primary) hypertension: Secondary | ICD-10-CM

## 2022-07-07 LAB — POCT GLYCOSYLATED HEMOGLOBIN (HGB A1C): Hemoglobin A1C: 7.8 % — AB (ref 4.0–5.6)

## 2022-07-07 MED ORDER — SITAGLIPTIN PHOSPHATE 100 MG PO TABS: 100.0000 mg | ORAL_TABLET | Freq: Every day | ORAL | 2 refills | Status: DC

## 2022-07-07 NOTE — Progress Notes (Signed)
.  Pt presents for chronic care management   -pt states that he has tried to decrease his soda intake

## 2022-07-07 NOTE — Patient Instructions (Signed)

## 2022-08-02 NOTE — Progress Notes (Unsigned)
Patient ID: Thomas Cummings, male    DOB: [birth year]  MRN: [id number]  CC: Chronic Care Management   Subjective: Thomas Cummings is a 57 y.o. male who presents for chronic care management.   His concerns today include:  Doing well on increased dose of Sitagliptin. He is taking Metformin and Farxiga as prescribed. He denies red flag symptoms. Reports he is drinking more water. States before he was drinking a 12 pack of sodas daily. He does not have any issues/concerns for discussion today.  Patient Active Problem List   Diagnosis Date Noted   Traumatic complete tear of right rotator cuff 12/10/2020   Type 2 diabetes mellitus without complication, without long-term current use of insulin ([place]) 04/08/2018   Essential hypertension 04/08/2018   BMI 39.0-39.9,adult 04/08/2018   Testosterone deficiency in male 04/08/2018     Current Outpatient Medications on File Prior to Visit  Medication Sig Dispense Refill   amLODipine (NORVASC) 10 MG tablet Take 1 tablet (10 mg total) by mouth daily. 30 tablet 3   atorvastatin (LIPITOR) 40 MG tablet Take 1 tablet (40 mg total) by mouth daily. 90 tablet 1   blood glucose meter kit and supplies Dispense based on patient and insurance preference. Use up to four times daily as directed. (FOR ICD-10 E10.9, E11.9). 1 each 0   carvedilol (COREG) 12.5 MG tablet Take 1 tablet (12.5 mg total) by mouth 2 (two) times daily. 60 tablet 3   dapagliflozin propanediol (FARXIGA) 10 MG TABS tablet Take 1 tablet (10 mg total) by mouth daily before breakfast. 30 tablet 2   hydrochlorothiazide (HYDRODIURIL) 25 MG tablet Take 1 tablet (25 mg total) by mouth daily. 30 tablet 3   lisinopril (ZESTRIL) 40 MG tablet Take 1 tablet (40 mg total) by mouth daily. 30 tablet 3   metFORMIN (GLUCOPHAGE) 1000 MG tablet Take 1 tablet (1,000 mg total) by mouth 2 (two) times daily with a meal. 60 tablet 3   sitaGLIPtin (JANUVIA) 100 MG tablet Take 1 tablet (100 mg total) by mouth daily. 30  tablet 2   spironolactone (ALDACTONE) 25 MG tablet Take 1 tablet (25 mg total) by mouth 2 (two) times daily. 60 tablet 3   No current facility-administered medications on file prior to visit.    No Known Allergies  Social History   Socioeconomic History   Marital status: Married    Spouse name: Not on file   Number of children: Not on file   Years of education: Not on file   Highest education level: Not on file  Occupational History   Not on file  Tobacco Use   Smoking status: Never    Passive exposure: Never   Smokeless tobacco: Never  Vaping Use   Vaping Use: Never used  Substance and Sexual Activity   Alcohol use: Not Currently   Drug use: Never   Sexual activity: Yes    Partners: Female  Other Topics Concern   Not on file  Social History Narrative   Not on file   Social Determinants of Health   Financial Resource Strain: Not on file  Food Insecurity: Not on file  Transportation Needs: Not on file  Physical Activity: Not on file  Stress: Not on file  Social Connections: Not on file  Intimate Partner Violence: Not on file    Family History  Problem Relation Age of Onset   Diabetes Mother    Alzheimer's disease Father     Past Surgical History:  Procedure Laterality Date  NO PAST SURGERIES      ROS: Review of Systems Negative except as stated above  PHYSICAL EXAM: BP 109/73 (BP Location: Left Arm, Patient Position: Sitting, Cuff Size: Large)   Pulse 96   Temp 98.3 F (36.8 C)   Resp 16   Ht 5' 10.98" (1.803 m)   Wt 263 lb (119.3 kg)   SpO2 97%   BMI 36.70 kg/m   Physical Exam HENT:     Head: Normocephalic and atraumatic.  Eyes:     Extraocular Movements: Extraocular movements intact.     Conjunctiva/sclera: Conjunctivae normal.     Pupils: Pupils are equal, round, and reactive to light.  Cardiovascular:     Rate and Rhythm: Normal rate and regular rhythm.     Pulses: Normal pulses.     Heart sounds: Normal heart sounds.  Pulmonary:      Effort: Pulmonary effort is normal.     Breath sounds: Normal breath sounds.  Musculoskeletal:     Cervical back: Normal range of motion and neck supple.  Neurological:     General: No focal deficit present.     Mental Status: He is alert and oriented to person, place, and time.  Psychiatric:        Mood and Affect: Mood normal.        Behavior: Behavior normal.   Results for orders placed or performed in visit on 08/04/22  POCT glycosylated hemoglobin (Hb A1C)  Result Value Ref Range   Hemoglobin A1C 7.3 (A) 4.0 - 5.6 %   HbA1c POC (<> result, manual entry)     HbA1c, POC (prediabetic range)     HbA1c, POC (controlled diabetic range)       ASSESSMENT AND PLAN: 1. Type 2 diabetes mellitus without complication, without long-term current use of insulin (HCC) - Hemoglobin A1c today relative to goal at 7.3%, goal 7%. This is improved compared to previous 7.8%.  - Continue Metformin, Dapagliflozin Propanediol, and Sitagliptin as prescribed. No refills needed as of present.  - Discussed the importance of healthy eating habits, low-carbohydrate diet, low-sugar diet, regular aerobic exercise (at least 150 minutes a week as tolerated) and medication compliance to achieve or maintain control of diabetes. - Keep appointment scheduled 09/02/2022 with Roma Schanz, MD. During the interim patient to follow-up with me as needed.  - POCT glycosylated hemoglobin (Hb A1C)   Patient was given the opportunity to ask questions.  Patient verbalized understanding of the plan and was able to repeat key elements of the plan. Patient was given clear instructions to go to Emergency Department or return to medical center if symptoms don't improve, worsen, or new problems develop.The patient verbalized understanding.   Orders Placed This Encounter  Procedures   POCT glycosylated hemoglobin (Hb A1C)    Follow-up with primary provider as scheduled.   Camillia Herter, NP

## 2022-08-04 ENCOUNTER — Encounter: Payer: Self-pay | Admitting: Family

## 2022-08-04 ENCOUNTER — Ambulatory Visit (INDEPENDENT_AMBULATORY_CARE_PROVIDER_SITE_OTHER): Payer: Federal, State, Local not specified - PPO | Admitting: Family

## 2022-08-04 VITALS — BP 109/73 | HR 96 | Temp 98.3°F | Resp 16 | Ht 70.98 in | Wt 263.0 lb

## 2022-08-04 DIAGNOSIS — E119 Type 2 diabetes mellitus without complications: Secondary | ICD-10-CM | POA: Diagnosis not present

## 2022-08-04 LAB — POCT GLYCOSYLATED HEMOGLOBIN (HGB A1C): Hemoglobin A1C: 7.3 % — AB (ref 4.0–5.6)

## 2022-08-04 NOTE — Progress Notes (Signed)
.  Pt presents for chronic care management   

## 2022-08-16 ENCOUNTER — Ambulatory Visit: Payer: Federal, State, Local not specified - PPO | Admitting: Family Medicine

## 2022-08-29 ENCOUNTER — Other Ambulatory Visit: Payer: Self-pay | Admitting: Family

## 2022-08-29 DIAGNOSIS — E119 Type 2 diabetes mellitus without complications: Secondary | ICD-10-CM

## 2022-09-02 ENCOUNTER — Ambulatory Visit: Payer: Federal, State, Local not specified - PPO | Attending: Family Medicine | Admitting: Family Medicine

## 2022-09-02 ENCOUNTER — Encounter: Payer: Self-pay | Admitting: Family Medicine

## 2022-09-02 VITALS — BP 130/83 | HR 80 | Ht 71.0 in | Wt 268.0 lb

## 2022-09-02 DIAGNOSIS — E119 Type 2 diabetes mellitus without complications: Secondary | ICD-10-CM | POA: Diagnosis not present

## 2022-09-02 DIAGNOSIS — G4730 Sleep apnea, unspecified: Secondary | ICD-10-CM | POA: Diagnosis not present

## 2022-09-02 DIAGNOSIS — I10 Essential (primary) hypertension: Secondary | ICD-10-CM

## 2022-09-02 DIAGNOSIS — R2232 Localized swelling, mass and lump, left upper limb: Secondary | ICD-10-CM

## 2022-09-02 MED ORDER — OZEMPIC (0.25 OR 0.5 MG/DOSE) 2 MG/1.5ML ~~LOC~~ SOPN: 0.2500 mg | PEN_INJECTOR | SUBCUTANEOUS | 1 refills | Status: DC

## 2022-09-02 MED ORDER — CARVEDILOL 25 MG PO TABS: 25.0000 mg | ORAL_TABLET | Freq: Two times a day (BID) | ORAL | 3 refills | Status: DC

## 2022-09-02 NOTE — Progress Notes (Signed)
Discuss medications Discuss CPAP machine

## 2022-09-02 NOTE — Progress Notes (Signed)
Subjective:  Patient ID: Thomas Cummings, male    DOB: June 05, 1966  Age: 57 y.o. MRN: [id number]  CC: Establish Care   HPI Thomas Cummings is a 57 y.o. year old male with a history of hypertension, type 2 diabetes mellitus (A1c 7.3), obesity, obstructive sleep apnea who presents to establish care. Previously followed at [facility]. Accompanied by spouse to this visit.  Interval History:  He is concerned that he takes too many medications. Doing well on his antihypertensives and he is also on a statin. He is on multiple antihypertensives and also on multiple medications for his diabetes.  He has also noticed some erectile dysfunction.  He got tested for sleep apnea prior to relocating to [facility] but never got a sleep apnea machine. He had to buy one on his own when he got to [place] as he needed one to use but now found out the machine is out dated. 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 He has had left forearm swelling which is tender for several years but this has not progressively increased in size. Past Medical History:  Diagnosis Date   BMI 40.0-44.9, adult ([place])    Diabetes mellitus due to underlying condition with hyperosmolarity without coma, without long-term current use of insulin ([place])    Essential hypertension    Hyperlipidemia    Phreesia 02/19/2020   Hypertension    Phreesia 02/19/2020   Mixed hyperlipidemia    OSA (obstructive sleep apnea)     Past Surgical History:  Procedure Laterality Date   NO PAST SURGERIES      Family History  Problem Relation Age of Onset   Diabetes Mother    Alzheimer's disease Father     Social History   Socioeconomic History   Marital status: Married    Spouse name: Not on file   Number of children: Not on file   Years of education: Not on file   Highest education level: Not on file  Occupational History   Not on file  Tobacco Use   Smoking status: Never    Passive exposure:  Never   Smokeless tobacco: Never  Vaping Use   Vaping Use: Never used  Substance and Sexual Activity   Alcohol use: Not Currently   Drug use: Never   Sexual activity: Yes    Partners: Female  Other Topics Concern   Not on file  Social History Narrative   Not on file   Social Determinants of Health   Financial Resource Strain: Not on file  Food Insecurity: Not on file  Transportation Needs: Not on file  Physical Activity: Not on file  Stress: Not on file  Social Connections: Not on file    No Known Allergies  Outpatient Medications Prior to Visit  Medication Sig Dispense Refill   amLODipine (NORVASC) 10 MG tablet Take 1 tablet (10 mg total) by mouth daily. 30 tablet 3   atorvastatin (LIPITOR) 40 MG tablet Take 1 tablet (40 mg total) by mouth daily. 90 tablet 1   blood glucose meter kit and supplies Dispense based on patient and insurance preference. Use up to four times daily as directed. (FOR ICD-10 E10.9, E11.9). 1 each 0   dapagliflozin propanediol (FARXIGA) 10 MG TABS tablet TAKE 1 TABLET BY MOUTH ONCE DAILY BEFORE BREAKFAST 30 tablet 0   lisinopril (ZESTRIL) 40 MG tablet Take 1 tablet (40 mg total) by mouth daily. 30 tablet 3   metFORMIN (GLUCOPHAGE) 1000  MG tablet Take 1 tablet (1,000 mg total) by mouth 2 (two) times daily with a meal. 60 tablet 3   carvedilol (COREG) 12.5 MG tablet Take 1 tablet (12.5 mg total) by mouth 2 (two) times daily. 60 tablet 3   hydrochlorothiazide (HYDRODIURIL) 25 MG tablet Take 1 tablet (25 mg total) by mouth daily. 30 tablet 3   sitaGLIPtin (JANUVIA) 100 MG tablet Take 1 tablet (100 mg total) by mouth daily. 30 tablet 2   spironolactone (ALDACTONE) 25 MG tablet Take 1 tablet (25 mg total) by mouth 2 (two) times daily. 60 tablet 3   No facility-administered medications prior to visit.     ROS Review of Systems  Constitutional:  Negative for activity change and appetite change.  HENT:  Negative for sinus pressure and sore throat.    Respiratory:  Negative for chest tightness, shortness of breath and wheezing.   Cardiovascular:  Negative for chest pain and palpitations.  Gastrointestinal:  Negative for abdominal distention, abdominal pain and constipation.  Genitourinary: Negative.   Musculoskeletal:        See HPI  Psychiatric/Behavioral:  Negative for behavioral problems and dysphoric mood.     Objective:  BP 130/83   Pulse 80   Ht 5\' 11"  (1.803 m)   Wt 268 lb (121.6 kg)   SpO2 99%   BMI 37.38 kg/m      09/02/2022    8:51 AM 08/04/2022    3:19 PM 07/07/2022    3:18 PM  BP/Weight  Systolic BP [id number]  Diastolic BP 83 73 85  Wt. (Lbs) 268 263 270  BMI 37.38 kg/m2 36.7 kg/m2 37.67 kg/m2      Physical Exam Constitutional:      Appearance: He is well-developed.  Cardiovascular:     Rate and Rhythm: Normal rate.     Heart sounds: Normal heart sounds. No murmur heard. Pulmonary:     Effort: Pulmonary effort is normal.     Breath sounds: Normal breath sounds. No wheezing or rales.  Chest:     Chest wall: No tenderness.  Abdominal:     General: Bowel sounds are normal. There is no distension.     Palpations: Abdomen is soft. There is no mass.     Tenderness: There is no abdominal tenderness.  Musculoskeletal:        General: Normal range of motion.     Right lower leg: No edema.     Left lower leg: No edema.     Comments: Tenderness on the left forearm induration with slight discoloration  Neurological:     Mental Status: He is alert and oriented to person, place, and time.  Psychiatric:        Mood and Affect: Mood normal.        Latest Ref Rng & Units 06/01/2022    4:01 PM 10/16/2021    3:16 PM 04/17/2021    3:10 PM  CMP  Glucose 70 - 99 mg/dL 174  124  119   BUN 6 - 24 mg/dL 14  18  19    Creatinine 0.76 - 1.27 mg/dL 1.14  1.24  1.13   Sodium 134 - 144 mmol/L 143  137  140   Potassium 3.5 - 5.2 mmol/L 4.3  3.9  4.2   Chloride 96 - 106 mmol/L 106  99  102   CO2 20 - 29 mmol/L 20   23  23    Calcium 8.7 - 10.2 mg/dL 9.3  9.5  9.7  Lipid Panel     Component Value Date/Time   CHOL 138 10/16/2021 1516   CHOL 159 10/25/2017 0000   TRIG 84 10/16/2021 1516   TRIG 100 10/25/2017 0000   HDL 43 10/16/2021 1516   CHOLHDL 3.2 10/16/2021 1516   CHOLHDL 3.5 10/25/2017 0000   LDLCALC 79 10/16/2021 1516    CBC    Component Value Date/Time   WBC 9.0 05/21/2019 1555   RBC 4.82 05/21/2019 1555   HGB 15.0 05/21/2019 1555   HCT 43.9 05/21/2019 1555   PLT 309 05/21/2019 1555   MCV 91 05/21/2019 1555   MCH 31.1 05/21/2019 1555   MCHC 34.2 05/21/2019 1555   RDW 13.0 05/21/2019 1555    Lab Results  Component Value Date   HGBA1C 7.3 (A) 08/04/2022    Assessment & Plan:  1. Essential (primary) hypertension Controlled Due to desire to consolidate his medications I have discontinued spironolactone and increased dose of Coreg He is also a truck driver and so I have discontinued the HCTZ due to diuretic effect Will check blood pressure at next visit Counseled on blood pressure goal of less than 130/80, low-sodium, DASH diet, medication compliance, 150 minutes of moderate intensity exercise per week. Discussed medication compliance, adverse effects. - carvedilol (COREG) 25 MG tablet; Take 1 tablet (25 mg total) by mouth 2 (two) times daily.  Dispense: 60 tablet; Refill: 3  2. Localized swelling of left forearm Possibly a tender lipoma - [place] LT UPPER EXTREM LTD SOFT TISSUE NON VASCULAR; Future  3. Type 2 diabetes mellitus without complication, without long-term current use of insulin (HCC) Controlled with A1c of 7.3 Discontinue Januvia as I am not certain we are achieving any much glycemic benefit with that Initiate GLP-1 RA after shared decision making and titrate up to maximum tolerable dose due to cardiovascular and weight benefit.  We have discussed adverse effects as well and we do not have any contraindications at this time At next visit we will titrate further and  consider discontinuing [place] He complained that [place] cost him $100 at the pharmacy Counseled on Diabetic diet, my plate method, [id number] minutes of moderate intensity exercise/week Blood sugar logs with fasting goals of 80-120 mg/dl, random of less than 180 and in the event of sugars less than 60 mg/dl or greater than 400 mg/dl encouraged to notify the clinic. Advised on the need for annual eye exams, annual foot exams, Pneumonia vaccine. - Semaglutide,0.25 or 0.5MG /DOS, (OZEMPIC, 0.25 OR 0.5 MG/DOSE,) 2 MG/1.5ML SOPN; Inject 0.25 mg into the skin once a week.  Dispense: 2 mL; Refill: 1  4. Sleep apnea, unspecified type - Home sleep test; Future  Discussed his erectile dysfunction and possible treatment options but he is not ready at this time for any treatment.  Meds ordered this encounter  Medications   carvedilol (COREG) 25 MG tablet    Sig: Take 1 tablet (25 mg total) by mouth 2 (two) times daily.    Dispense:  60 tablet    Refill:  3    Dose increase, discontinue Spironolactone and HCTZ   Semaglutide,0.25 or 0.5MG /DOS, (OZEMPIC, 0.25 OR 0.5 MG/DOSE,) 2 MG/1.5ML SOPN    Sig: Inject 0.25 mg into the skin once a week.    Dispense:  2 mL    Refill:  1    Discontinue Januvia    Follow-up: Return in about 1 month (around 10/03/2022) for Hypertension and diabetes.       Charlott Rakes, MD, FAAFP. [place]  [facility] [place], [place]   09/02/2022, 2:23 PM

## 2022-09-02 NOTE — Patient Instructions (Signed)
Semaglutide Injection What is this medication? SEMAGLUTIDE (SEM a GLOO tide) treats type 2 diabetes. It works by increasing insulin levels in your body, which decreases your blood sugar (glucose). It also reduces the amount of sugar released into the blood and slows down your digestion. It can also be used to lower the risk of heart attack and stroke in people with type 2 diabetes. Changes to diet and exercise are often combined with this medication. This medicine may be used for other purposes; ask your health care provider or pharmacist if you have questions. COMMON BRAND NAME(S): OZEMPIC What should I tell my care team before I take this medication? They need to know if you have any of these conditions: Endocrine tumors (MEN 2) or if someone in your family had these tumors Eye disease, vision problems History of pancreatitis Kidney disease Stomach problems Thyroid cancer or if someone in your family had thyroid cancer An unusual or allergic reaction to semaglutide, other medications, foods, dyes, or preservatives Pregnant or trying to get pregnant Breast-feeding How should I use this medication? This medication is for injection under the skin of your upper leg (thigh), stomach area, or upper arm. It is given once every week (every 7 days). You will be taught how to prepare and give this medication. Use exactly as directed. Take your medication at regular intervals. Do not take it more often than directed. If you use this medication with insulin, you should inject this medication and the insulin separately. Do not mix them together. Do not give the injections right next to each other. Change (rotate) injection sites with each injection. It is important that you put your used needles and syringes in a special sharps container. Do not put them in a trash can. If you do not have a sharps container, call your pharmacist or care team to get one. A special MedGuide will be given to you by the  pharmacist with each prescription and refill. Be sure to read this information carefully each time. This medication comes with INSTRUCTIONS FOR USE. Ask your pharmacist for directions on how to use this medication. Read the information carefully. Talk to your pharmacist or care team if you have questions. Talk to your care team about the use of this medication in children. Special care may be needed. Overdosage: If you think you have taken too much of this medicine contact a poison control center or emergency room at once. NOTE: This medicine is only for you. Do not share this medicine with others. What if I miss a dose? If you miss a dose, take it as soon as you can within 5 days after the missed dose. Then take your next dose at your regular weekly time. If it has been longer than 5 days after the missed dose, do not take the missed dose. Take the next dose at your regular time. Do not take double or extra doses. If you have questions about a missed dose, contact your care team for advice. What may interact with this medication? Other medications for diabetes Many medications may cause changes in blood sugar, these include: Alcohol containing beverages Antiviral medications for HIV or AIDS Aspirin and aspirin-like medications Certain medications for blood pressure, heart disease, irregular heart beat Chromium Diuretics Male hormones, such as estrogens or progestins, birth control pills Fenofibrate Gemfibrozil Isoniazid Lanreotide Male hormones or anabolic steroids MAOIs like Carbex, Eldepryl, Marplan, Nardil, and Parnate Medications for weight loss Medications for allergies, asthma, cold, or cough Medications for depression,   anxiety, or psychotic disturbances Niacin Nicotine NSAIDs, medications for pain and inflammation, like ibuprofen or naproxen Octreotide Pasireotide Pentamidine Phenytoin Probenecid Quinolone antibiotics such as ciprofloxacin, levofloxacin, ofloxacin Some  herbal dietary supplements Steroid medications such as prednisone or cortisone Sulfamethoxazole; trimethoprim Thyroid hormones Some medications can hide the warning symptoms of low blood sugar (hypoglycemia). You may need to monitor your blood sugar more closely if you are taking one of these medications. These include: Beta-blockers, often used for high blood pressure or heart problems (examples include atenolol, metoprolol, propranolol) Clonidine Guanethidine Reserpine This list may not describe all possible interactions. Give your health care provider a list of all the medicines, herbs, non-prescription drugs, or dietary supplements you use. Also tell them if you smoke, drink alcohol, or use illegal drugs. Some items may interact with your medicine. What should I watch for while using this medication? Visit your care team for regular checks on your progress. Drink plenty of fluids while taking this medication. Check with your care team if you get an attack of severe diarrhea, nausea, and vomiting. The loss of too much body fluid can make it dangerous for you to take this medication. A test called the HbA1C (A1C) will be monitored. This is a simple blood test. It measures your blood sugar control over the last 2 to 3 months. You will receive this test every 3 to 6 months. Learn how to check your blood sugar. Learn the symptoms of low and high blood sugar and how to manage them. Always carry a quick-source of sugar with you in case you have symptoms of low blood sugar. Examples include hard sugar candy or glucose tablets. Make sure others know that you can choke if you eat or drink when you develop serious symptoms of low blood sugar, such as seizures or unconsciousness. They must get medical help at once. Tell your care team if you have high blood sugar. You might need to change the dose of your medication. If you are sick or exercising more than usual, you might need to change the dose of your  medication. Do not skip meals. Ask your care team if you should avoid alcohol. Many nonprescription cough and cold products contain sugar or alcohol. These can affect blood sugar. Pens should never be shared. Even if the needle is changed, sharing may result in passing of viruses like hepatitis or HIV. Wear a medical ID bracelet or chain, and carry a card that describes your disease and details of your medication and dosage times. Do not become pregnant while taking this medication. Women should inform their care team if they wish to become pregnant or think they might be pregnant. There is a potential for serious side effects to an unborn child. Talk to your care team for more information. What side effects may I notice from receiving this medication? Side effects that you should report to your care team as soon as possible: Allergic reactions--skin rash, itching, hives, swelling of the face, lips, tongue, or throat Change in vision Dehydration--increased thirst, dry mouth, feeling faint or lightheaded, headache, dark yellow or brown urine Gallbladder problems--severe stomach pain, nausea, vomiting, fever Heart palpitations--rapid, pounding, or irregular heartbeat Kidney injury--decrease in the amount of urine, swelling of the ankles, hands, or feet Pancreatitis--severe stomach pain that spreads to your back or gets worse after eating or when touched, fever, nausea, vomiting Thyroid cancer--new mass or lump in the neck, pain or trouble swallowing, trouble breathing, hoarseness Side effects that usually do not require medical   attention (report to your care team if they continue or are bothersome): Diarrhea Loss of appetite Nausea Stomach pain Vomiting This list may not describe all possible side effects. Call your doctor for medical advice about side effects. You may report side effects to FDA at 1-800-FDA-1088. Where should I keep my medication? Keep out of the reach of children. Store  unopened pens in a refrigerator between 2 and 8 degrees C (36 and 46 degrees F). Do not freeze. Protect from light and heat. After you first use the pen, it can be stored for 56 days at room temperature between 15 and 30 degrees C (59 and 86 degrees F) or in a refrigerator. Throw away your used pen after 56 days or after the expiration date, whichever comes first. Do not store your pen with the needle attached. If the needle is left on, medication may leak from the pen. NOTE: This sheet is a summary. It may not cover all possible information. If you have questions about this medicine, talk to your doctor, pharmacist, or health care provider.  2023 Elsevier/Gold Standard (2020-09-04 00:00:00)  

## 2022-09-08 ENCOUNTER — Telehealth: Payer: Self-pay

## 2022-09-08 NOTE — Telephone Encounter (Signed)
Sleep center called to schedule patient and they informed me that they will do Prior Auth first then reach out to the patient to schedule.

## 2022-09-14 ENCOUNTER — Ambulatory Visit (HOSPITAL_BASED_OUTPATIENT_CLINIC_OR_DEPARTMENT_OTHER): Payer: Federal, State, Local not specified - PPO | Attending: Family Medicine | Admitting: Internal Medicine

## 2022-09-14 DIAGNOSIS — G4733 Obstructive sleep apnea (adult) (pediatric): Secondary | ICD-10-CM | POA: Diagnosis not present

## 2022-09-14 DIAGNOSIS — G4730 Sleep apnea, unspecified: Secondary | ICD-10-CM

## 2022-09-18 DIAGNOSIS — G4730 Sleep apnea, unspecified: Secondary | ICD-10-CM | POA: Diagnosis not present

## 2022-09-18 NOTE — Procedures (Signed)
    Patient Name: Thomas Cummings, Thomas Cummings Date: 09/14/2022 Gender: Male D.O.B: 02/17/1966 Age (years): 70 Referring Provider: Jaclyn Shaggy Height (inches): 71 Interpreting Physician: Jetty Duhamel MD, ABSM Weight (lbs): 268 RPSGT: [place] BMI: 37 MRN: [id number] Neck Size: 18.00  CLINICAL INFORMATION Sleep Study Type: HST Indication for sleep study: OSA Epworth Sleepiness Score: 9  SLEEP STUDY TECHNIQUE A multi-channel overnight portable sleep study was performed. The channels recorded were: nasal airflow, thoracic respiratory movement, and oxygen saturation with a pulse oximetry. Snoring was also monitored.  MEDICATIONS Patient self administered medications include: none reported.  SLEEP ARCHITECTURE Patient was studied for 384.5 minutes. The sleep efficiency was 100.0 % and the patient was supine for 0%. The arousal index was 0.0 per hour.  RESPIRATORY PARAMETERS The overall AHI was 26.2 per hour, with a central apnea index of 0 per hour. The oxygen nadir was 86% during sleep.  CARDIAC DATA Mean heart rate during sleep was 70.7 bpm.  IMPRESSIONS - Moderate obstructive sleep apnea occurred during this study (AHI = 26.2/h). - Moderate oxygen desaturation was noted during this study (Min O2 = 86%, Mean 94%). - Patient snored.  DIAGNOSIS - Obstructive Sleep Apnea (G47.33)  RECOMMENDATIONS - Suggest CPAP titration sleep study or autopap. Other options would be based on clinical judgment. - Be careful with alcohol, sedatives and other CNS depressants that may worsen sleep apnea and disrupt normal sleep architecture. - Sleep hygiene should be reviewed to assess factors that may improve sleep quality. - Weight management and regular exercise should be initiated or continued.  [Electronically signed] 09/18/2022 12:16 PM  Jetty Duhamel MD, ABSM Diplomate, American Board of Sleep Medicine NPI: [id number]                         Jetty Duhamel Diplomate, American Board of Sleep Medicine  ELECTRONICALLY SIGNED ON:  09/18/2022, 12:11 PM [facility] PH: [phone]   FX: [phone] ACCREDITED BY THE AMERICAN ACADEMY OF SLEEP MEDICINE

## 2022-09-24 ENCOUNTER — Other Ambulatory Visit: Payer: Self-pay | Admitting: Family

## 2022-09-24 DIAGNOSIS — E119 Type 2 diabetes mellitus without complications: Secondary | ICD-10-CM

## 2022-09-24 DIAGNOSIS — I10 Essential (primary) hypertension: Secondary | ICD-10-CM

## 2022-09-24 NOTE — Telephone Encounter (Signed)
Requested medication (s) are due for refill today:   Yes/No  Requested medication (s) are on the active medication list:   Yes except for 2  Future visit scheduled:   Yes with Dr. Alvis Lemmings in 2 weeks but been seeing Amy Zonia Kief   Last ordered: Norvasc 06/01/2022 #30, 3 refills;   Aldactone 25 mg discontinued 09/02/2022;   Coreg 09/02/2022 #60, 3 refills;   HCTZ 25 mg discontinued 09/02/2022;   Lisinopril 40 mg 06/01/2022 #30, 3 refills;   Metformin 06/01/2022 #60, 3 refills  Returned for provider review.   Didn't know which provider, Ricky Stabs or Dr. Alvis Lemmings.      Requested Prescriptions  Pending Prescriptions Disp Refills   amLODipine (NORVASC) 10 MG tablet [Pharmacy Med Name: amLODIPine Besylate 10 MG Oral Tablet] 30 tablet 0    Sig: Take 1 tablet by mouth once daily     Cardiovascular: Calcium Channel Blockers 2 Passed - 09/24/2022  6:53 AM      Passed - Last BP in normal range    BP Readings from Last 1 Encounters:  09/02/22 130/83         Passed - Last Heart Rate in normal range    Pulse Readings from Last 1 Encounters:  09/02/22 80         Passed - Valid encounter within last 6 months    Recent Outpatient Visits           3 weeks ago Localized swelling of left forearm   [place] [facility] [place], [place], MD   1 month ago Type 2 diabetes mellitus without complication, without long-term current use of insulin (HCC)   [place] [facility] [place], Amy J, NP   2 months ago Primary hypertension   [place] [facility], Amy J, NP   10 months ago Type 2 diabetes mellitus without complication, without long-term current use of insulin (HCC)   [place] [facility], Amy J, NP   11 months ago Type 2 diabetes mellitus without complication, without long-term current use of insulin (HCC)    [facility] [place], Amy J, NP       Future  Appointments             In 2 weeks Hoy Register, MD [facility]             spironolactone (ALDACTONE) 25 MG tablet [Pharmacy Med Name: Spironolactone 25 MG Oral Tablet] 60 tablet 0    Sig: Take 1 tablet by mouth twice daily     Cardiovascular: Diuretics - Aldosterone Antagonist Passed - 09/24/2022  6:53 AM      Passed - Cr in normal range and within 180 days    Creatinine, Ser  Date Value Ref Range Status  06/01/2022 1.14 0.76 - 1.27 mg/dL Final         Passed - K in normal range and within 180 days    Potassium  Date Value Ref Range Status  06/01/2022 4.3 3.5 - 5.2 mmol/L Final  10/25/2017 5.6  Final         Passed - Na in normal range and within 180 days    Sodium  Date Value Ref Range Status  06/01/2022 143 134 - 144 mmol/L Final  10/25/2017 138  Final         Passed - eGFR is 30 or above  and within 180 days    EGFR (African American)  Date Value Ref Range Status  10/25/2017 70  Final   GFR calc Af Amer  Date Value Ref Range Status  08/20/2019 81 >59 mL/min/1.73 Final   GFR calc non Af Amer  Date Value Ref Range Status  08/20/2019 70 >59 mL/min/1.73 Final   eGFR  Date Value Ref Range Status  06/01/2022 75 >59 mL/min/1.73 Final         Passed - Last BP in normal range    BP Readings from Last 1 Encounters:  09/02/22 130/83         Passed - Valid encounter within last 6 months    Recent Outpatient Visits           3 weeks ago Localized swelling of left forearm   [place] [facility] [place], Odette Horns, MD   1 month ago Type 2 diabetes mellitus without complication, without long-term current use of insulin (HCC)   [place] [facility], Amy J, NP   2 months ago Primary hypertension   [place] [facility], Amy J, NP   10 months ago Type 2 diabetes mellitus without complication, without long-term current use of insulin (HCC)    [place] [facility], Amy J, NP   11 months ago Type 2 diabetes mellitus without complication, without long-term current use of insulin (HCC)   [place] [facility], Amy J, NP       Future Appointments             In 2 weeks Hoy Register, MD [facility]             carvedilol (COREG) 12.5 MG tablet [Pharmacy Med Name: Carvedilol 12.5 MG Oral Tablet] 60 tablet 0    Sig: Take 1 tablet by mouth twice daily     Cardiovascular: Beta Blockers 3 Failed - 09/24/2022  6:53 AM      Failed - AST in normal range and within 360 days    AST  Date Value Ref Range Status  08/20/2019 25 0 - 40 IU/L Final         Failed - ALT in normal range and within 360 days    ALT  Date Value Ref Range Status  08/20/2019 36 0 - 44 IU/L Final         Passed - Cr in normal range and within 360 days    Creatinine, Ser  Date Value Ref Range Status  06/01/2022 1.14 0.76 - 1.27 mg/dL Final         Passed - Last BP in normal range    BP Readings from Last 1 Encounters:  09/02/22 130/83         Passed - Last Heart Rate in normal range    Pulse Readings from Last 1 Encounters:  09/02/22 80         Passed - Valid encounter within last 6 months    Recent Outpatient Visits           3 weeks ago Localized swelling of left forearm   [place] [facility] [place], Odette Horns, MD   1 month ago Type 2 diabetes mellitus without complication, without long-term current use of insulin [facility])   [place] [facility], [place], NP   2 months  ago Primary hypertension   [place] [facility], Amy J, NP   10 months ago Type 2 diabetes mellitus without complication, without long-term current use of insulin (HCC)   [place] [facility], Amy J, NP   11 months ago Type 2 diabetes mellitus without complication,  without long-term current use of insulin (HCC)   [place] [facility], [place], NP       Future Appointments             In 2 weeks Hoy Register, MD [facility]             hydrochlorothiazide (HYDRODIURIL) 25 MG tablet [Pharmacy Med Name: hydroCHLOROthiazide 25 MG Oral Tablet] 30 tablet 0    Sig: Take 1 tablet by mouth once daily     Cardiovascular: Diuretics - Thiazide Passed - 09/24/2022  6:53 AM      Passed - Cr in normal range and within 180 days    Creatinine, Ser  Date Value Ref Range Status  06/01/2022 1.14 0.76 - 1.27 mg/dL Final         Passed - K in normal range and within 180 days    Potassium  Date Value Ref Range Status  06/01/2022 4.3 3.5 - 5.2 mmol/L Final  10/25/2017 5.6  Final         Passed - Na in normal range and within 180 days    Sodium  Date Value Ref Range Status  06/01/2022 143 134 - 144 mmol/L Final  10/25/2017 138  Final         Passed - Last BP in normal range    BP Readings from Last 1 Encounters:  09/02/22 130/83         Passed - Valid encounter within last 6 months    Recent Outpatient Visits           3 weeks ago Localized swelling of left forearm   [place] [facility] [place], Odette Horns, MD   1 month ago Type 2 diabetes mellitus without complication, without long-term current use of insulin (HCC)   [place] [facility], [place], NP   2 months ago Primary hypertension   [place] [facility] at [facility], Amy J, NP   10 months ago Type 2 diabetes mellitus without complication, without long-term current use of insulin (HCC)   [place] [facility], Amy J, NP   11 months ago Type 2 diabetes mellitus without complication, without long-term current use of insulin (HCC)   [place] [facility], Amy J, NP       Future Appointments              In 2 weeks Hoy Register, MD [facility]             lisinopril (ZESTRIL) 40 MG tablet [Pharmacy Med Name: Lisinopril 40 MG Oral Tablet] 30 tablet 0    Sig: Take 1 tablet by mouth once daily     Cardiovascular:  ACE Inhibitors Passed - 09/24/2022  6:53 AM      Passed - Cr in normal range and within 180 days    Creatinine, Ser  Date Value Ref Range Status  06/01/2022 1.14 0.76 - 1.27 mg/dL Final  Passed - K in normal range and within 180 days    Potassium  Date Value Ref Range Status  06/01/2022 4.3 3.5 - 5.2 mmol/L Final  10/25/2017 5.6  Final         Passed - Patient is not pregnant      Passed - Last BP in normal range    BP Readings from Last 1 Encounters:  09/02/22 130/83         Passed - Valid encounter within last 6 months    Recent Outpatient Visits           3 weeks ago Localized swelling of left forearm   [place] [facility] [place], Odette Horns, MD   1 month ago Type 2 diabetes mellitus without complication, without long-term current use of insulin (HCC)   [place] [facility], Amy J, NP   2 months ago Primary hypertension   [place] [facility], Amy J, NP   10 months ago Type 2 diabetes mellitus without complication, without long-term current use of insulin (HCC)   [place] [facility] Amy J, NP   11 months ago Type 2 diabetes mellitus without complication, without long-term current use of insulin (HCC)   [place] [facility], Amy J, NP       Future Appointments             In 2 weeks Hoy Register, MD [facility]             metFORMIN (GLUCOPHAGE) 1000 MG tablet [Pharmacy Med Name: metFORMIN HCl 1000 MG Oral Tablet] 60 tablet 0    Sig: TAKE 1 TABLET BY MOUTH TWICE DAILY WITH A MEAL     Endocrinology:  Diabetes -  Biguanides Failed - 09/24/2022  6:53 AM      Failed - B12 Level in normal range and within 720 days    No results found for: "VITAMINB12"       Failed - CBC within normal limits and completed in the last 12 months    WBC  Date Value Ref Range Status  05/21/2019 9.0 3.4 - 10.8 x10E3/uL Final   RBC  Date Value Ref Range Status  05/21/2019 4.82 4.14 - 5.80 x10E6/uL Final   Hemoglobin  Date Value Ref Range Status  05/21/2019 15.0 13.0 - 17.7 g/dL Final   Hematocrit  Date Value Ref Range Status  05/21/2019 43.9 37.5 - 51.0 % Final   MCHC  Date Value Ref Range Status  05/21/2019 34.2 31.5 - 35.7 g/dL Final   [facility]  Date Value Ref Range Status  05/21/2019 31.1 26.6 - 33.0 pg Final   MCV  Date Value Ref Range Status  05/21/2019 91 79 - 97 fL Final   No results found for: "PLTCOUNTKUC", "LABPLAT", "POCPLA" RDW  Date Value Ref Range Status  05/21/2019 13.0 11.6 - 15.4 % Final         Passed - Cr in normal range and within 360 days    Creatinine, Ser  Date Value Ref Range Status  06/01/2022 1.14 0.76 - 1.27 mg/dL Final         Passed - HBA1C is between 0 and 7.9 and within 180 days    Hemoglobin A1C  Date Value Ref Range Status  08/04/2022 7.3 (A) 4.0 - 5.6 % Final  10/25/2017 7.3  Final   Hgb  A1c MFr Bld  Date Value Ref Range Status  06/02/2020 7.8 (H) 4.8 - 5.6 % Final    Comment:             Prediabetes: 5.7 - 6.4          Diabetes: >6.4          Glycemic control for adults with diabetes: <7.0          Passed - eGFR in normal range and within 360 days    EGFR (African American)  Date Value Ref Range Status  10/25/2017 70  Final   GFR calc Af Amer  Date Value Ref Range Status  08/20/2019 81 >59 mL/min/1.73 Final   GFR calc non Af Amer  Date Value Ref Range Status  08/20/2019 70 >59 mL/min/1.73 Final   eGFR  Date Value Ref Range Status  06/01/2022 75 >59 mL/min/1.73 Final         Passed - Valid encounter within last 6 months    Recent Outpatient  Visits           3 weeks ago Localized swelling of left forearm   [place] [facility] [place], Odette Horns, MD   1 month ago Type 2 diabetes mellitus without complication, without long-term current use of insulin (HCC)   [place] [facility], [place], NP   2 months ago Primary hypertension   [place] [facility], Amy J, NP   10 months ago Type 2 diabetes mellitus without complication, without long-term current use of insulin (HCC)   [place] [facility], Amy J, NP   11 months ago Type 2 diabetes mellitus without complication, without long-term current use of insulin [facility])   [place] [facility], [place], NP       Future Appointments             In 2 weeks Hoy Register, MD [facility]

## 2022-09-30 ENCOUNTER — Other Ambulatory Visit: Payer: Self-pay | Admitting: Family Medicine

## 2022-09-30 DIAGNOSIS — E119 Type 2 diabetes mellitus without complications: Secondary | ICD-10-CM

## 2022-09-30 DIAGNOSIS — I10 Essential (primary) hypertension: Secondary | ICD-10-CM

## 2022-09-30 MED ORDER — AMLODIPINE BESYLATE 10 MG PO TABS
10.0000 mg | ORAL_TABLET | Freq: Every day | ORAL | 0 refills | Status: DC
Start: 2022-09-30 — End: 2022-10-11

## 2022-09-30 MED ORDER — LISINOPRIL 40 MG PO TABS
40.0000 mg | ORAL_TABLET | Freq: Every day | ORAL | 0 refills | Status: DC
Start: 2022-09-30 — End: 2022-10-11

## 2022-09-30 MED ORDER — METFORMIN HCL 1000 MG PO TABS
1000.0000 mg | ORAL_TABLET | Freq: Two times a day (BID) | ORAL | 0 refills | Status: DC
Start: 2022-09-30 — End: 2022-10-11

## 2022-10-01 ENCOUNTER — Ambulatory Visit
Admission: RE | Admit: 2022-10-01 | Discharge: 2022-10-01 | Disposition: A | Discharge status: Home / Self Care | Payer: Federal, State, Local not specified - PPO | Source: Ambulatory Visit | Attending: Family Medicine | Admitting: Family Medicine

## 2022-10-01 DIAGNOSIS — M79632 Pain in left forearm: Secondary | ICD-10-CM | POA: Diagnosis not present

## 2022-10-01 DIAGNOSIS — M7989 Other specified soft tissue disorders: Secondary | ICD-10-CM | POA: Diagnosis not present

## 2022-10-01 DIAGNOSIS — R2232 Localized swelling, mass and lump, left upper limb: Secondary | ICD-10-CM

## 2022-10-06 ENCOUNTER — Other Ambulatory Visit: Payer: Self-pay | Admitting: Family Medicine

## 2022-10-06 DIAGNOSIS — R2232 Localized swelling, mass and lump, left upper limb: Secondary | ICD-10-CM

## 2022-10-11 ENCOUNTER — Other Ambulatory Visit: Payer: Self-pay | Admitting: Family Medicine

## 2022-10-11 ENCOUNTER — Telehealth: Payer: Self-pay | Admitting: Family Medicine

## 2022-10-11 ENCOUNTER — Encounter: Payer: Self-pay | Admitting: Family Medicine

## 2022-10-11 ENCOUNTER — Ambulatory Visit: Payer: Federal, State, Local not specified - PPO | Attending: Family Medicine | Admitting: Family Medicine

## 2022-10-11 VITALS — BP 125/82 | HR 81 | Temp 97.8°F | Ht 71.0 in | Wt 265.2 lb

## 2022-10-11 DIAGNOSIS — G4733 Obstructive sleep apnea (adult) (pediatric): Secondary | ICD-10-CM | POA: Diagnosis not present

## 2022-10-11 DIAGNOSIS — I10 Essential (primary) hypertension: Secondary | ICD-10-CM

## 2022-10-11 DIAGNOSIS — E785 Hyperlipidemia, unspecified: Secondary | ICD-10-CM

## 2022-10-11 DIAGNOSIS — E119 Type 2 diabetes mellitus without complications: Secondary | ICD-10-CM

## 2022-10-11 DIAGNOSIS — E1169 Type 2 diabetes mellitus with other specified complication: Secondary | ICD-10-CM

## 2022-10-11 LAB — POCT GLYCOSYLATED HEMOGLOBIN (HGB A1C): HbA1c, POC (controlled diabetic range): 6.8 % (ref 0.0–7.0)

## 2022-10-11 LAB — GLUCOSE, POCT (MANUAL RESULT ENTRY): POC Glucose: 116 mg/dl — AB (ref 70–99)

## 2022-10-11 MED ORDER — OZEMPIC (0.25 OR 0.5 MG/DOSE) 2 MG/1.5ML ~~LOC~~ SOPN: 0.5000 mg | PEN_INJECTOR | SUBCUTANEOUS | 6 refills | Status: DC

## 2022-10-11 MED ORDER — ATORVASTATIN CALCIUM 40 MG PO TABS
40.0000 mg | ORAL_TABLET | Freq: Every day | ORAL | 1 refills | Status: DC
Start: 2022-10-11 — End: 2023-04-12

## 2022-10-11 MED ORDER — LISINOPRIL 40 MG PO TABS
40.0000 mg | ORAL_TABLET | Freq: Every day | ORAL | 1 refills | Status: DC
Start: 2022-10-11 — End: 2023-04-12

## 2022-10-11 MED ORDER — METFORMIN HCL 1000 MG PO TABS
1000.0000 mg | ORAL_TABLET | Freq: Two times a day (BID) | ORAL | 1 refills | Status: DC
Start: 2022-10-11 — End: 2023-04-06

## 2022-10-11 MED ORDER — MISC. DEVICES MISC
0 refills | Status: AC
Start: 2022-10-11 — End: ?

## 2022-10-11 MED ORDER — CARVEDILOL 25 MG PO TABS
25.0000 mg | ORAL_TABLET | Freq: Two times a day (BID) | ORAL | 1 refills | Status: DC
Start: 2022-10-11 — End: 2023-04-12

## 2022-10-11 MED ORDER — AMLODIPINE BESYLATE 10 MG PO TABS
10.0000 mg | ORAL_TABLET | Freq: Every day | ORAL | 1 refills | Status: DC
Start: 2022-10-11 — End: 2023-04-12

## 2022-10-11 NOTE — Progress Notes (Signed)
Discuss CPAP Machine.

## 2022-10-11 NOTE — Telephone Encounter (Signed)
Can you please assist with his CPAP machine? I have placed an order for this.  Thank you

## 2022-10-11 NOTE — Progress Notes (Signed)
Subjective:  Patient ID: Thomas Cummings, male    DOB: [birth year]  Age: 56 y.o. MRN: [id number]  CC: Diabetes   HPI Thomas Cummings is a 57 y.o. year old male with a history of hypertension, type 2 diabetes mellitus (A1c 7.3), obesity, obstructive sleep apnea   Interval History: A1C is 6.8 down from 7.3 previously.  Januvia was discontinued at his last visit and Ozempic initiated.  He is doing well on Ozempic.   He had a sleep study on 09/14/2022 which revealed: IMPRESSIONS - Moderate obstructive sleep apnea occurred during this study (AHI = 26.2/h). - Moderate oxygen desaturation was noted during this study (Min O2 = 86%, Mean 94%). - Patient snored.   DIAGNOSIS - Obstructive Sleep Apnea (G47.33)    He underwent ultrasound due to left forearm swelling which revealed he was referred to general surgery. Ultrasound revealed: IMPRESSION: Nonspecific 0.6 cm soft tissue mass within the subcutaneous soft tissues of the left forearm. Differential considerations include both benign and malignant soft tissue neoplasms. Tissue sampling is recommended for further evaluation.    Denies presence of additional concerns today. Past Medical History:  Diagnosis Date   BMI 40.0-44.9, adult (HCC)    Diabetes mellitus due to underlying condition with hyperosmolarity without coma, without long-term current use of insulin (HCC)    Essential hypertension    Hyperlipidemia    Phreesia 02/19/2020   Hypertension    Phreesia 02/19/2020   Mixed hyperlipidemia    OSA (obstructive sleep apnea)     Past Surgical History:  Procedure Laterality Date   NO PAST SURGERIES      Family History  Problem Relation Age of Onset   Diabetes Mother    Alzheimer's disease Father     Social History   Socioeconomic History   Marital status: Married    Spouse name: Not on file   Number of children: Not on file   Years of education: Not on file   Highest education level: Not on file  Occupational  History   Not on file  Tobacco Use   Smoking status: Never    Passive exposure: Never   Smokeless tobacco: Never  Vaping Use   Vaping Use: Never used  Substance and Sexual Activity   Alcohol use: Not Currently   Drug use: Never   Sexual activity: Yes    Partners: Female  Other Topics Concern   Not on file  Social History Narrative   Not on file   Social Determinants of Health   Financial Resource Strain: Not on file  Food Insecurity: Not on file  Transportation Needs: Not on file  Physical Activity: Not on file  Stress: Not on file  Social Connections: Not on file    No Known Allergies  Outpatient Medications Prior to Visit  Medication Sig Dispense Refill   blood glucose meter kit and supplies Dispense based on patient and insurance preference. Use up to four times daily as directed. (FOR ICD-10 E10.9, E11.9). 1 each 0   amLODipine (NORVASC) 10 MG tablet Take 1 tablet (10 mg total) by mouth daily. 90 tablet 0   atorvastatin (LIPITOR) 40 MG tablet Take 1 tablet (40 mg total) by mouth daily. 90 tablet 1   carvedilol (COREG) 25 MG tablet Take 1 tablet (25 mg total) by mouth 2 (two) times daily. 60 tablet 3   dapagliflozin propanediol (FARXIGA) 10 MG TABS tablet TAKE 1 TABLET BY MOUTH ONCE DAILY BEFORE BREAKFAST 30 tablet 0   lisinopril (ZESTRIL) 40 MG  tablet Take 1 tablet (40 mg total) by mouth daily. 90 tablet 0   metFORMIN (GLUCOPHAGE) 1000 MG tablet Take 1 tablet (1,000 mg total) by mouth 2 (two) times daily with a meal. 30 tablet 0   Semaglutide,0.25 or 0.5MG /DOS, (OZEMPIC, 0.25 OR 0.5 MG/DOSE,) 2 MG/1.5ML SOPN Inject 0.25 mg into the skin once a week. 2 mL 1   No facility-administered medications prior to visit.     ROS Review of Systems  Constitutional:  Negative for activity change and appetite change.  HENT:  Negative for sinus pressure and sore throat.   Respiratory:  Negative for chest tightness, shortness of breath and wheezing.   Cardiovascular:  Negative  for chest pain and palpitations.  Gastrointestinal:  Negative for abdominal distention, abdominal pain and constipation.  Genitourinary: Negative.   Musculoskeletal: Negative.   Psychiatric/Behavioral:  Negative for behavioral problems and dysphoric mood.     Objective:  BP 125/82   Pulse 81   Temp 97.8 F (36.6 C) (Oral)   Ht 5\' 11"  (1.803 m)   Wt 265 lb 3.2 oz (120.3 kg)   SpO2 98%   BMI 36.99 kg/m      10/11/2022    9:44 AM 09/14/2022    3:00 PM 09/02/2022    8:51 AM  BP/Weight  Systolic BP 125  914  Diastolic BP 82  83  Wt. (Lbs) 265.2 265 268  BMI 36.99 kg/m2 36.96 kg/m2 37.38 kg/m2   Wt Readings from Last 3 Encounters:  10/11/22 265 lb 3.2 oz (120.3 kg)  09/14/22 265 lb (120.2 kg)  09/02/22 268 lb (121.6 kg)      Physical Exam Constitutional:      Appearance: He is well-developed.  Cardiovascular:     Rate and Rhythm: Normal rate.     Heart sounds: Normal heart sounds. No murmur heard. Pulmonary:     Effort: Pulmonary effort is normal.     Breath sounds: Normal breath sounds. No wheezing or rales.  Chest:     Chest wall: No tenderness.  Abdominal:     General: Bowel sounds are normal. There is no distension.     Palpations: Abdomen is soft. There is no mass.     Tenderness: There is no abdominal tenderness.  Musculoskeletal:        General: Normal range of motion.     Right lower leg: No edema.     Left lower leg: No edema.  Neurological:     Mental Status: He is alert and oriented to person, place, and time.  Psychiatric:        Mood and Affect: Mood normal.        Latest Ref Rng & Units 06/01/2022    4:01 PM 10/16/2021    3:16 PM 04/17/2021    3:10 PM  CMP  Glucose 70 - 99 mg/dL 782  956  213   BUN 6 - 24 mg/dL 14  18  19    Creatinine 0.76 - 1.27 mg/dL 0.86  5.78  4.69   Sodium 134 - 144 mmol/L 143  137  140   Potassium 3.5 - 5.2 mmol/L 4.3  3.9  4.2   Chloride 96 - 106 mmol/L 106  99  102   CO2 20 - 29 mmol/L 20  23  23    Calcium 8.7 -  10.2 mg/dL 9.3  9.5  9.7     Lipid Panel     Component Value Date/Time   CHOL 138 10/16/2021 1516   CHOL  159 10/25/2017 0000   TRIG 84 10/16/2021 1516   TRIG 100 10/25/2017 0000   HDL 43 10/16/2021 1516   CHOLHDL 3.2 10/16/2021 1516   CHOLHDL 3.5 10/25/2017 0000   LDLCALC 79 10/16/2021 1516    CBC    Component Value Date/Time   WBC 9.0 05/21/2019 1555   RBC 4.82 05/21/2019 1555   HGB 15.0 05/21/2019 1555   HCT 43.9 05/21/2019 1555   PLT 309 05/21/2019 1555   MCV 91 05/21/2019 1555   MCH 31.1 05/21/2019 1555   MCHC 34.2 05/21/2019 1555   RDW 13.0 05/21/2019 1555    Lab Results  Component Value Date   HGBA1C 6.8 10/11/2022    Assessment & Plan:  1. Type 2 diabetes mellitus without complication, without long-term current use of insulin (HCC) A1c of 6.8 down from 7.3 previously Due to desire to reduce the number of medication he takes I have discontinued Farxiga and increased Ozempic dose Continue metformin Counseled on Diabetic diet, my plate method, 409 minutes of moderate intensity exercise/week Blood sugar logs with fasting goals of 80-120 mg/dl, random of less than 811 and in the event of sugars less than 60 mg/dl or greater than 914 mg/dl encouraged to notify the clinic. Advised on the need for annual eye exams, annual foot exams, Pneumonia vaccine. - POCT glucose (manual entry) - POCT glycosylated hemoglobin (Hb A1C) - Microalbumin / creatinine urine ratio - LP+Non-HDL Cholesterol - CMP14+EGFR - Semaglutide,0.25 or 0.5MG /DOS, (OZEMPIC, 0.25 OR 0.5 MG/DOSE,) 2 MG/1.5ML SOPN; Inject 0.5 mg into the skin once a week.  Dispense: 2 mL; Refill: 6 - metFORMIN (GLUCOPHAGE) 1000 MG tablet; Take 1 tablet (1,000 mg total) by mouth 2 (two) times daily with a meal.  Dispense: 180 tablet; Refill: 1  2. Essential (primary) hypertension Controlled At his last visit, his antihypertensives were consolidated - amLODipine (NORVASC) 10 MG tablet; Take 1 tablet (10 mg total) by  mouth daily.  Dispense: 90 tablet; Refill: 1 - carvedilol (COREG) 25 MG tablet; Take 1 tablet (25 mg total) by mouth 2 (two) times daily.  Dispense: 180 tablet; Refill: 1 - lisinopril (ZESTRIL) 40 MG tablet; Take 1 tablet (40 mg total) by mouth daily.  Dispense: 90 tablet; Refill: 1  3. Hyperlipidemia associated with type 2 diabetes mellitus (HCC) Controlled Low-cholesterol diet - atorvastatin (LIPITOR) 40 MG tablet; Take 1 tablet (40 mg total) by mouth daily.  Dispense: 90 tablet; Refill: 1  4. OSA (obstructive sleep apnea) Will work on obtaining this for him from the DME company. - Misc. Devices MISC; CPAP machine on AutoPap 5 to 15 cm water.  Diagnosis obstructive sleep apnea  Dispense: 1 each; Refill: 0    Meds ordered this encounter  Medications   Misc. Devices MISC    Sig: CPAP machine on AutoPap 5 to 15 cm water.  Diagnosis obstructive sleep apnea    Dispense:  1 each    Refill:  0   Semaglutide,0.25 or 0.5MG /DOS, (OZEMPIC, 0.25 OR 0.5 MG/DOSE,) 2 MG/1.5ML SOPN    Sig: Inject 0.5 mg into the skin once a week.    Dispense:  2 mL    Refill:  6    Discontinue Farxiga   amLODipine (NORVASC) 10 MG tablet    Sig: Take 1 tablet (10 mg total) by mouth daily.    Dispense:  90 tablet    Refill:  1   atorvastatin (LIPITOR) 40 MG tablet    Sig: Take 1 tablet (40 mg total) by mouth daily.  Dispense:  90 tablet    Refill:  1   carvedilol (COREG) 25 MG tablet    Sig: Take 1 tablet (25 mg total) by mouth 2 (two) times daily.    Dispense:  180 tablet    Refill:  1   lisinopril (ZESTRIL) 40 MG tablet    Sig: Take 1 tablet (40 mg total) by mouth daily.    Dispense:  90 tablet    Refill:  1   metFORMIN (GLUCOPHAGE) 1000 MG tablet    Sig: Take 1 tablet (1,000 mg total) by mouth 2 (two) times daily with a meal.    Dispense:  180 tablet    Refill:  1    Follow-up: Return in about 6 months (around 04/12/2023) for Chronic medical conditions.       Hoy Register, MD,  FAAFP. [facility] [place], [place] [phone]   10/11/2022, 12:33 PM

## 2022-10-11 NOTE — Patient Instructions (Signed)
Living With Sleep Apnea Sleep apnea is a condition in which breathing pauses or becomes shallow during sleep. Sleep apnea is most commonly caused by a collapsed or blocked airway. People with sleep apnea usually snore loudly. They may have times when they gasp and stop breathing for 10 seconds or more during sleep. This may happen many times during the night. The breaks in breathing also interrupt the deep sleep that you need to feel rested. Even if you do not completely wake up from the gaps in breathing, your sleep may not be restful and you feel tired during the day. You may also have a headache in the morning and low energy during the day, and you may feel anxious or depressed. How can sleep apnea affect me? Sleep apnea increases your chances of extreme tiredness during the day (daytime fatigue). It can also increase your risk for health conditions, such as: Heart attack. Stroke. Obesity. Type 2 diabetes. Heart failure. Irregular heartbeat. High blood pressure. If you have daytime fatigue as a result of sleep apnea, you may be more likely to: Perform poorly at school or work. Fall asleep while driving. Have difficulty with attention. Develop depression or anxiety. Have sexual dysfunction. What actions can I take to manage sleep apnea? Sleep apnea treatment  If you were given a device to open your airway while you sleep, use it only as told by your health care provider. You may be given: An oral appliance. This is a custom-made mouthpiece that shifts your lower jaw forward. A continuous positive airway pressure (CPAP) device. This device blows air through a mask when you breathe out (exhale). A nasal expiratory positive airway pressure (EPAP) device. This device has valves that you put into each nostril. A bi-level positive airway pressure (BIPAP) device. This device blows air through a mask when you breathe in (inhale) and breathe out (exhale). You may need surgery if other treatments  do not work for you. Sleep habits Go to sleep and wake up at the same time every day. This helps set your internal clock (circadian rhythm) for sleeping. If you stay up later than usual, such as on weekends, try to get up in the morning within 2 hours of your normal wake time. Try to get at least 7-9 hours of sleep each night. Stop using a computer, tablet, and mobile phone a few hours before bedtime. Do not take long naps during the day. If you nap, limit it to 30 minutes. Have a relaxing bedtime routine. Reading or listening to music may relax you and help you sleep. Use your bedroom only for sleep. Keep your television and computer out of your bedroom. Keep your bedroom cool, dark, and quiet. Use a supportive mattress and pillows. Follow your health care provider's instructions for other changes to sleep habits. Nutrition Do not eat heavy meals in the evening. Do not have caffeine in the later part of the day. The effects of caffeine can last for more than 5 hours. Follow your health care provider's or dietitian's instructions for any diet changes. Lifestyle     Do not drink alcohol before bedtime. Alcohol can cause you to fall asleep at first, but then it can cause you to wake up in the middle of the night and have trouble getting back to sleep. Do not use any products that contain nicotine or tobacco. These products include cigarettes, chewing tobacco, and vaping devices, such as e-cigarettes. If you need help quitting, ask your health care provider. Medicines Take   over-the-counter and prescription medicines only as told by your health care provider. Do not use over-the-counter sleep medicine. You can become dependent on this medicine, and it can make sleep apnea worse. Do not use medicines, such as sedatives and narcotics, unless told by your health care provider. Activity Exercise on most days, but avoid exercising in the evening. Exercising near bedtime can interfere with  sleeping. If possible, spend time outside every day. Natural light helps regulate your circadian rhythm. General information Lose weight if you need to, and maintain a healthy weight. Keep all follow-up visits. This is important. If you are having surgery, make sure to tell your health care provider that you have sleep apnea. You may need to bring your device with you. Where to find more information Learn more about sleep apnea and daytime fatigue from: American Sleep Association: sleepassociation.org National Sleep Foundation: sleepfoundation.org National Heart, Lung, and Blood Institute: nhlbi.nih.gov Summary Sleep apnea is a condition in which breathing pauses or becomes shallow during sleep. Sleep apnea can cause daytime fatigue and other serious health conditions. You may need to wear a device while sleeping to help keep your airway open. If you are having surgery, make sure to tell your health care provider that you have sleep apnea. You may need to bring your device with you. Making changes to sleep habits, diet, lifestyle, and activity can help you manage sleep apnea. This information is not intended to replace advice given to you by your health care provider. Make sure you discuss any questions you have with your health care provider. Document Revised: 01/07/2021 Document Reviewed: 05/09/2020 Elsevier Patient Education  2023 Elsevier Inc.  

## 2022-10-12 LAB — CMP14+EGFR
ALT: 26 IU/L (ref 0–44)
AST: 19 IU/L (ref 0–40)
Albumin/Globulin Ratio: 1.3 (ref 1.2–2.2)
Albumin: 4.1 g/dL (ref 3.8–4.9)
Alkaline Phosphatase: 128 IU/L — ABNORMAL HIGH (ref 44–121)
BUN/Creatinine Ratio: 18 (ref 9–20)
BUN: 16 mg/dL (ref 6–24)
Bilirubin Total: 0.7 mg/dL (ref 0.0–1.2)
CO2: 19 mmol/L — ABNORMAL LOW (ref 20–29)
Calcium: 9.7 mg/dL (ref 8.7–10.2)
Chloride: 104 mmol/L (ref 96–106)
Creatinine, Ser: 0.91 mg/dL (ref 0.76–1.27)
Globulin, Total: 3.2 g/dL (ref 1.5–4.5)
Glucose: 96 mg/dL (ref 70–99)
Potassium: 4.2 mmol/L (ref 3.5–5.2)
Sodium: 140 mmol/L (ref 134–144)
Total Protein: 7.3 g/dL (ref 6.0–8.5)
eGFR: 98 mL/min/{1.73_m2} (ref >59)

## 2022-10-12 LAB — LP+NON-HDL CHOLESTEROL
Cholesterol, Total: 129 mg/dL (ref 100–199)
HDL: 45 mg/dL (ref >39)
LDL Chol Calc (NIH): 71 mg/dL (ref 0–99)
Total Non-HDL-Chol (LDL+VLDL): 84 mg/dL (ref 0–129)
Triglycerides: 60 mg/dL (ref 0–149)
VLDL Cholesterol Cal: 13 mg/dL (ref 5–40)

## 2022-10-12 MED ORDER — OZEMPIC (0.25 OR 0.5 MG/DOSE) 2 MG/3ML ~~LOC~~ SOPN: 0.5000 mg | PEN_INJECTOR | SUBCUTANEOUS | 2 refills | Status: DC

## 2022-10-12 NOTE — Telephone Encounter (Signed)
I called patient and spoke to both he and his wife, Latrice. They did not have a preference for DME companies.  Latrice explained that they purchased a CPAP machine out of pocket in the past and now they want to go through his insurance  I explained that I can send the referral to [facility] and they will confirm if they are in network and patient's coverage and any out of pocket expense.  If they are out of network, I will contact another DME company. Both the patient and his wife were in agreement with the plan.  Order then faxed to [employer] [facility]

## 2022-10-12 NOTE — Telephone Encounter (Signed)
Requested medication (s) are due for refill today: na  Requested medication (s) are on the active medication list: yes  Last refill:  na  Future visit scheduled: yes in 6 months  Notes to clinic:  Pharmacy comment: Medication not available. 1.18ml pen is no longer available. has been replaced by the 3ml pen. thanks!  Please reorder     Requested Prescriptions  Pending Prescriptions Disp Refills   OZEMPIC, 0.25 OR 0.5 MG/DOSE, 2 MG/3ML SOPN [Pharmacy Med Name: OZEMPIC (0.25&0.5) 2MG /3ML PEN] 3 mL 6    Sig: inject 0.5mg  into the skin once a week     Endocrinology:  Diabetes - GLP-1 Receptor Agonists - semaglutide Passed - 10/11/2022 12:33 PM      Passed - HBA1C in normal range and within 180 days    Hemoglobin A1C  Date Value Ref Range Status  10/25/2017 7.3  Final   HbA1c, POC (controlled diabetic range)  Date Value Ref Range Status  10/11/2022 6.8 0.0 - 7.0 % Final         Passed - Cr in normal range and within 360 days    Creatinine, Ser  Date Value Ref Range Status  10/11/2022 0.91 0.76 - 1.27 mg/dL Final         Passed - Valid encounter within last 6 months    Recent Outpatient Visits           Yesterday Type 2 diabetes mellitus without complication, without long-term current use of insulin (HCC)   [place] [facility] [place], Odette Horns, MD   1 month ago Localized swelling of left forearm   [place] [place], Odette Horns, MD   2 months ago Type 2 diabetes mellitus without complication, without long-term current use of insulin [facility])   [place] [facility], [place], NP   3 months ago Primary hypertension   [place] [facility], Amy J, NP   11 months ago Type 2 diabetes mellitus without complication, without long-term current use of insulin [facility])   [place] [facility], [place], NP       Future Appointments              In 6 months Hoy Register, MD [facility]

## 2022-10-19 DIAGNOSIS — G4733 Obstructive sleep apnea (adult) (pediatric): Secondary | ICD-10-CM | POA: Diagnosis not present

## 2022-11-10 DIAGNOSIS — M25532 Pain in left wrist: Secondary | ICD-10-CM | POA: Diagnosis not present

## 2022-11-17 DIAGNOSIS — G4733 Obstructive sleep apnea (adult) (pediatric): Secondary | ICD-10-CM | POA: Diagnosis not present

## 2022-11-19 DIAGNOSIS — G4733 Obstructive sleep apnea (adult) (pediatric): Secondary | ICD-10-CM | POA: Diagnosis not present

## 2022-11-27 DIAGNOSIS — M25532 Pain in left wrist: Secondary | ICD-10-CM | POA: Diagnosis not present

## 2022-12-02 DIAGNOSIS — R2232 Localized swelling, mass and lump, left upper limb: Secondary | ICD-10-CM | POA: Diagnosis not present

## 2022-12-14 ENCOUNTER — Telehealth: Payer: Self-pay

## 2022-12-14 NOTE — Telephone Encounter (Signed)
I spoke to [place] Day and she confirmed that the patient received his CPAP machine 10/19/2022.

## 2022-12-17 DIAGNOSIS — G4733 Obstructive sleep apnea (adult) (pediatric): Secondary | ICD-10-CM | POA: Diagnosis not present

## 2022-12-19 DIAGNOSIS — G4733 Obstructive sleep apnea (adult) (pediatric): Secondary | ICD-10-CM | POA: Diagnosis not present

## 2023-01-17 DIAGNOSIS — G4733 Obstructive sleep apnea (adult) (pediatric): Secondary | ICD-10-CM | POA: Diagnosis not present

## 2023-01-19 DIAGNOSIS — G4733 Obstructive sleep apnea (adult) (pediatric): Secondary | ICD-10-CM | POA: Diagnosis not present

## 2023-02-19 DIAGNOSIS — G4733 Obstructive sleep apnea (adult) (pediatric): Secondary | ICD-10-CM | POA: Diagnosis not present

## 2023-03-16 DIAGNOSIS — E119 Type 2 diabetes mellitus without complications: Secondary | ICD-10-CM | POA: Diagnosis not present

## 2023-03-21 DIAGNOSIS — G4733 Obstructive sleep apnea (adult) (pediatric): Secondary | ICD-10-CM | POA: Diagnosis not present

## 2023-04-05 ENCOUNTER — Other Ambulatory Visit: Payer: Self-pay | Admitting: Family Medicine

## 2023-04-05 DIAGNOSIS — D1800 Hemangioma unspecified site: Secondary | ICD-10-CM | POA: Diagnosis not present

## 2023-04-05 DIAGNOSIS — R2232 Localized swelling, mass and lump, left upper limb: Secondary | ICD-10-CM | POA: Diagnosis not present

## 2023-04-05 DIAGNOSIS — E119 Type 2 diabetes mellitus without complications: Secondary | ICD-10-CM

## 2023-04-05 DIAGNOSIS — G5632 Lesion of radial nerve, left upper limb: Secondary | ICD-10-CM | POA: Diagnosis not present

## 2023-04-05 DIAGNOSIS — D1801 Hemangioma of skin and subcutaneous tissue: Secondary | ICD-10-CM | POA: Diagnosis not present

## 2023-04-06 NOTE — Telephone Encounter (Signed)
Requested medication (s) are due for refill today: Yes  Requested medication (s) are on the active medication list: Yes  Last refill:  10/11/22  Future visit scheduled: Yes  Notes to clinic:  Prescription has expired.    Requested Prescriptions  Pending Prescriptions Disp Refills   metFORMIN (GLUCOPHAGE) 1000 MG tablet [Pharmacy Med Name: metFORMIN HCl 1000 MG Oral Tablet] 180 tablet 0    Sig: TAKE 1 TABLET BY MOUTH TWICE DAILY WITH A MEAL     Endocrinology:  Diabetes - Biguanides Failed - 04/05/2023  6:53 AM      Failed - B12 Level in normal range and within 720 days    No results found for: "VITAMINB12"       Failed - CBC within normal limits and completed in the last 12 months    WBC  Date Value Ref Range Status  05/21/2019 9.0 3.4 - 10.8 x10E3/uL Final   RBC  Date Value Ref Range Status  05/21/2019 4.82 4.14 - 5.80 x10E6/uL Final   Hemoglobin  Date Value Ref Range Status  05/21/2019 15.0 13.0 - 17.7 g/dL Final   Hematocrit  Date Value Ref Range Status  05/21/2019 43.9 37.5 - 51.0 % Final   MCHC  Date Value Ref Range Status  05/21/2019 34.2 31.5 - 35.7 g/dL Final   [facility]  Date Value Ref Range Status  05/21/2019 31.1 26.6 - 33.0 pg Final   MCV  Date Value Ref Range Status  05/21/2019 91 79 - 97 fL Final   No results found for: "PLTCOUNTKUC", "LABPLAT", "POCPLA" RDW  Date Value Ref Range Status  05/21/2019 13.0 11.6 - 15.4 % Final         Passed - Cr in normal range and within 360 days    Creatinine, Ser  Date Value Ref Range Status  10/11/2022 0.91 0.76 - 1.27 mg/dL Final         Passed - HBA1C is between 0 and 7.9 and within 180 days    Hemoglobin A1C  Date Value Ref Range Status  10/25/2017 7.3  Final   HbA1c, POC (controlled diabetic range)  Date Value Ref Range Status  10/11/2022 6.8 0.0 - 7.0 % Final         Passed - eGFR in normal range and within 360 days    EGFR (African American)  Date Value Ref Range Status  10/25/2017 70  Final    GFR calc Af Amer  Date Value Ref Range Status  08/20/2019 81 >59 mL/min/1.73 Final   GFR calc non Af Amer  Date Value Ref Range Status  08/20/2019 70 >59 mL/min/1.73 Final   eGFR  Date Value Ref Range Status  10/11/2022 98 >59 mL/min/1.73 Final         Passed - Valid encounter within last 6 months    Recent Outpatient Visits           5 months ago Type 2 diabetes mellitus without complication, without long-term current use of insulin (HCC)   [facility] [place], Odette Horns, MD   7 months ago Localized swelling of left forearm   [facility], Odette Horns, MD   8 months ago Type 2 diabetes mellitus without complication, without long-term current use of insulin [facility])   [place] [facility], [place], NP   9 months ago Primary hypertension   [place] [facility], Salomon Fick, NP  1 year ago Type 2 diabetes mellitus without complication, without long-term current use of insulin [facility])   [place] [facility], [place], NP       Future Appointments             In 6 days Hoy Register, MD [facility]

## 2023-04-07 DIAGNOSIS — Z024 Encounter for examination for driving license: Secondary | ICD-10-CM | POA: Diagnosis not present

## 2023-04-09 IMAGING — DX DG SHOULDER 2+V*R*
2 series · 2 of 2 positions shown · non-contrast
Comparison: None.

CLINICAL DATA: Fall yesterday.  Right shoulder pain

EXAM:
RIGHT SHOULDER - 2+ VIEW

[shoulder ap]
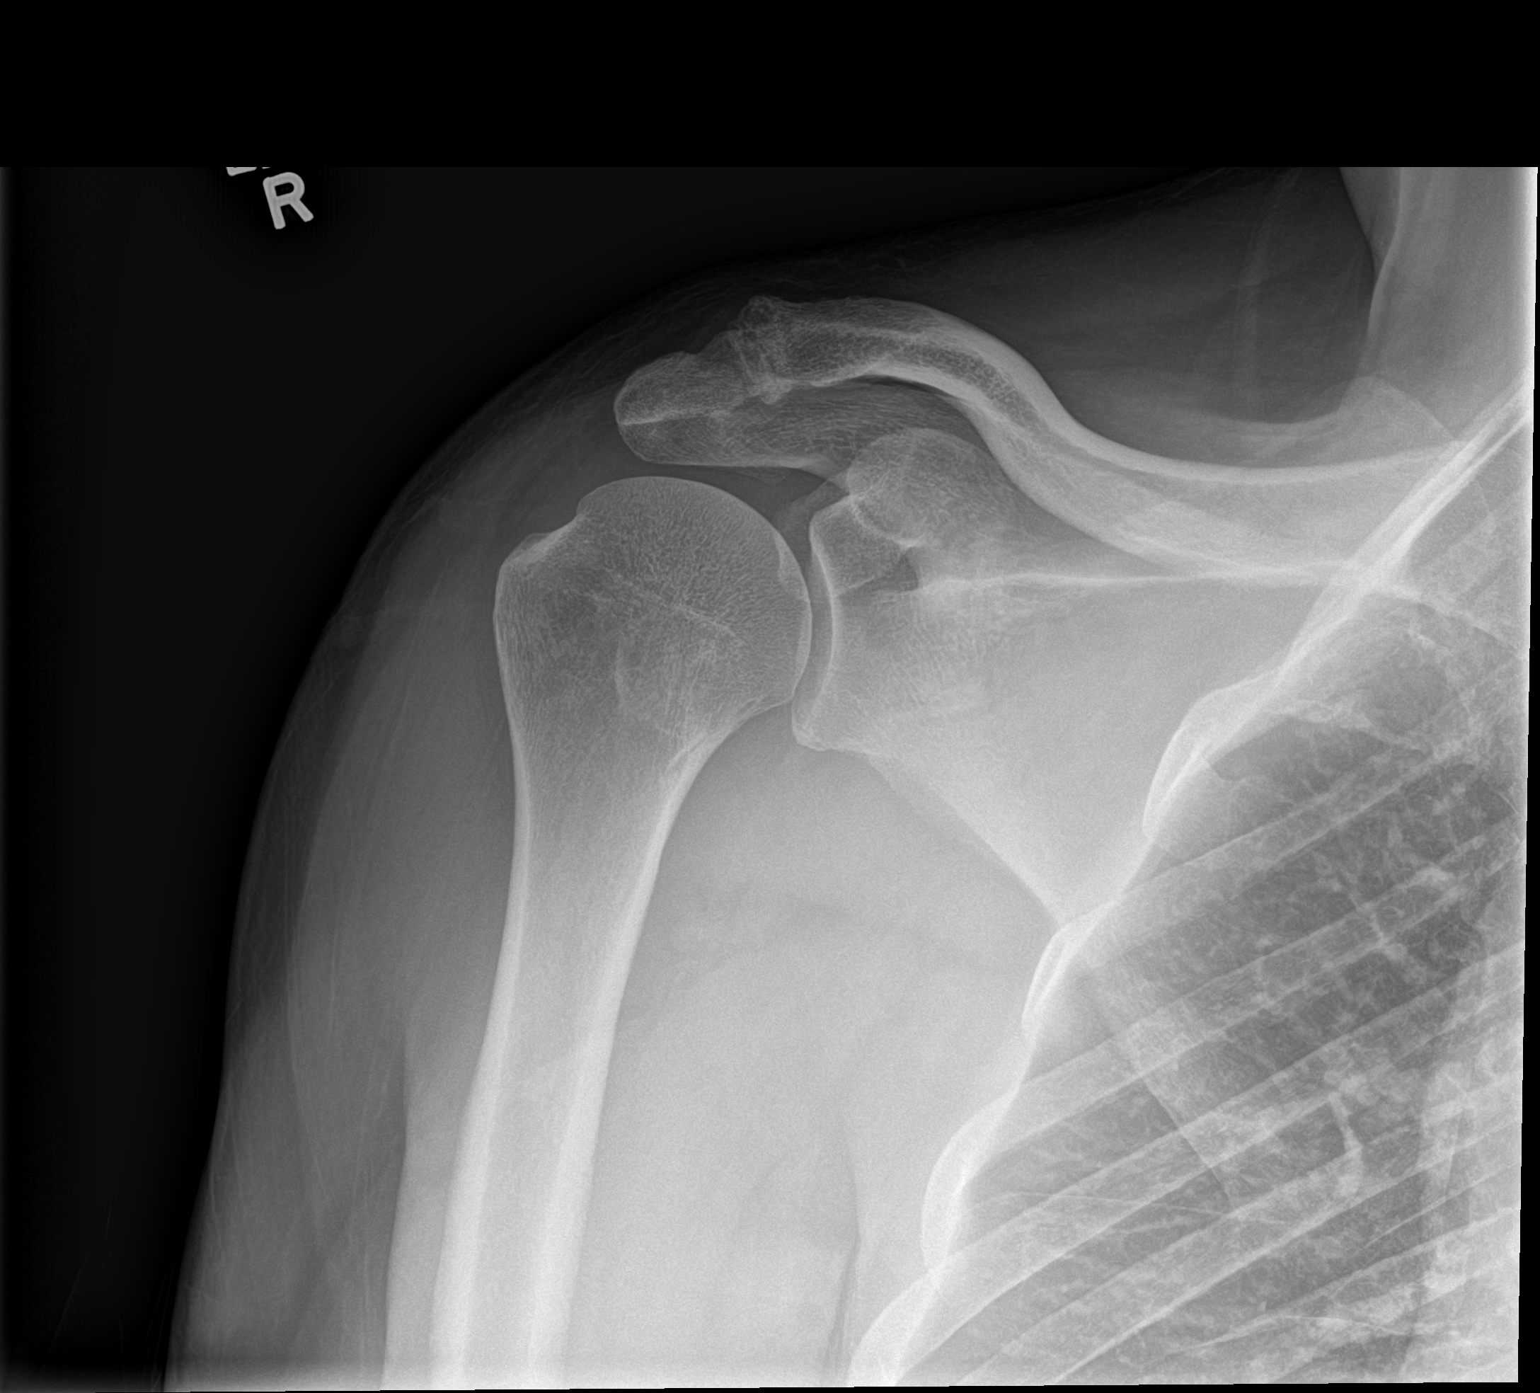

[shoulder y-view]
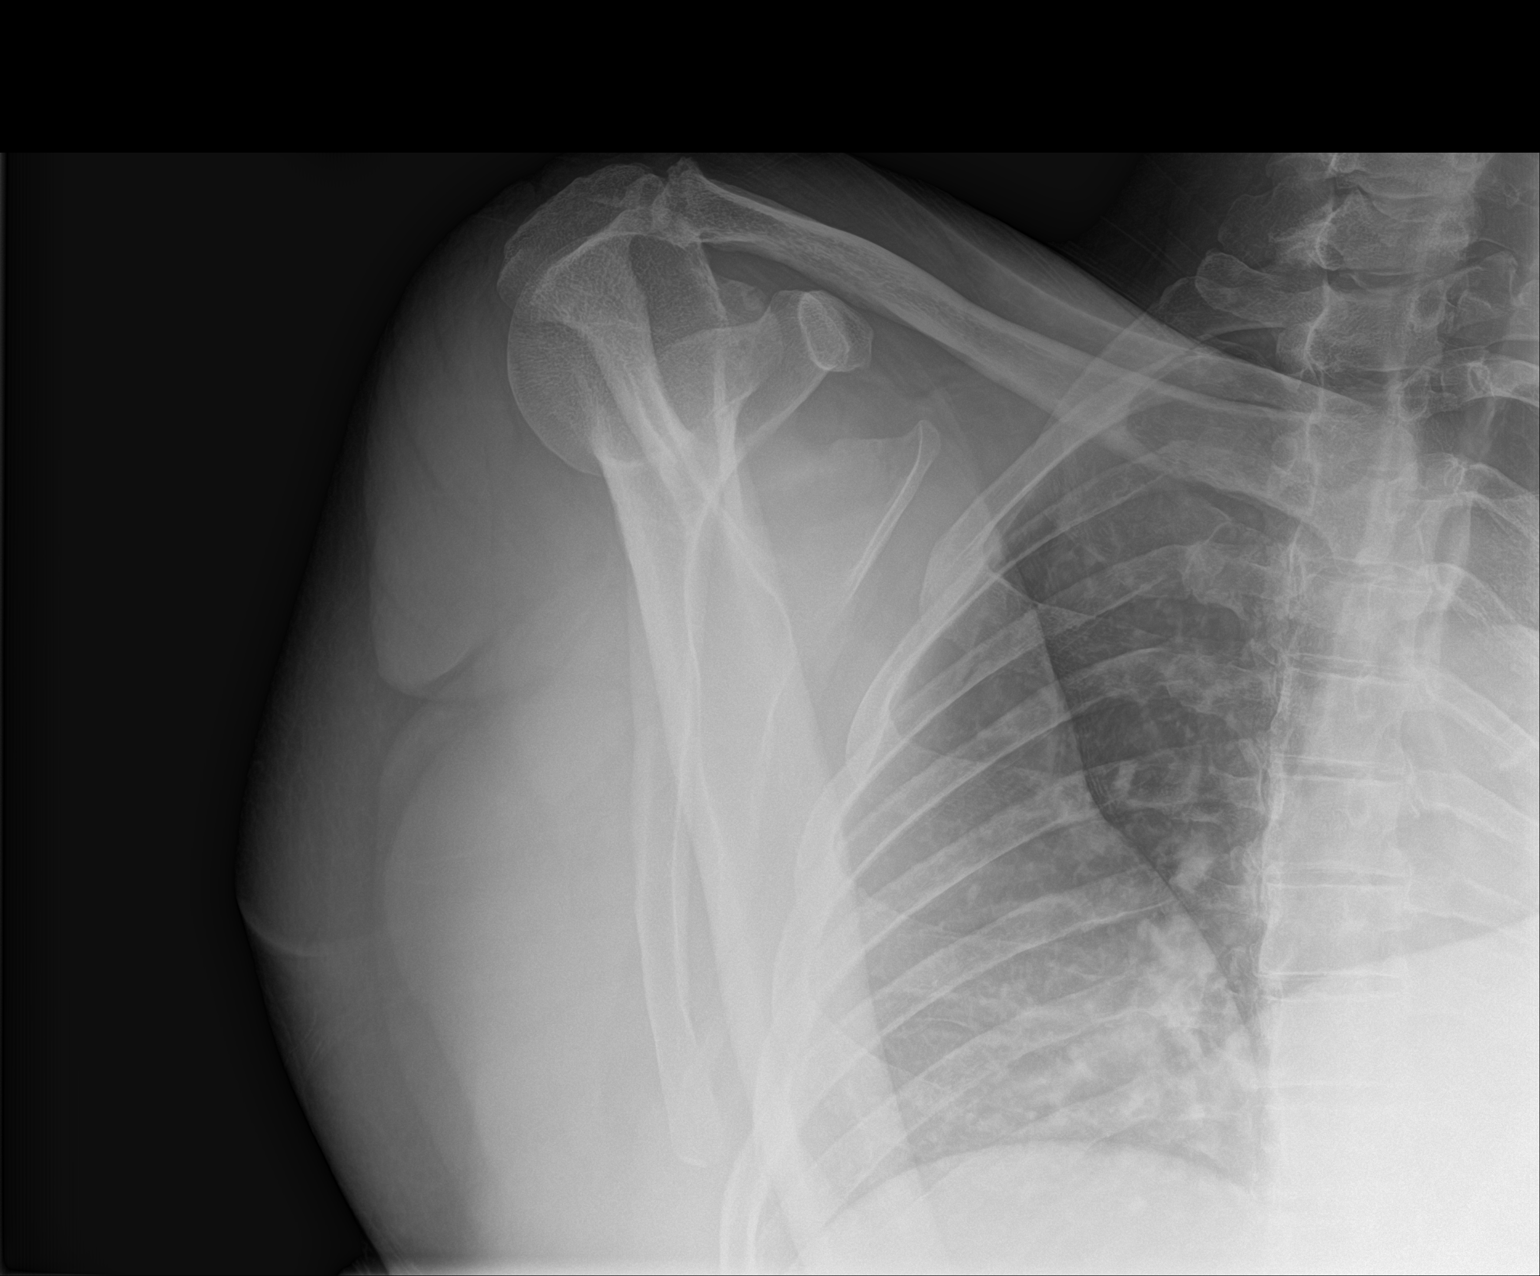

[2 of 2 positions shown; findings below may reference images not displayed]

FINDINGS: Normal alignment no fracture

Degenerative change and spurring in the AC joint. Glenohumeral joint
normal.
IMPRESSION: Negative for fracture

## 2023-04-12 ENCOUNTER — Ambulatory Visit: Payer: Federal, State, Local not specified - PPO | Attending: Family Medicine | Admitting: Family Medicine

## 2023-04-12 ENCOUNTER — Encounter: Payer: Self-pay | Admitting: Family Medicine

## 2023-04-12 ENCOUNTER — Telehealth: Payer: Self-pay

## 2023-04-12 VITALS — BP 143/90 | HR 80 | Ht 71.0 in | Wt 265.8 lb

## 2023-04-12 DIAGNOSIS — Z1211 Encounter for screening for malignant neoplasm of colon: Secondary | ICD-10-CM

## 2023-04-12 DIAGNOSIS — Z7985 Long-term (current) use of injectable non-insulin antidiabetic drugs: Secondary | ICD-10-CM

## 2023-04-12 DIAGNOSIS — B351 Tinea unguium: Secondary | ICD-10-CM

## 2023-04-12 DIAGNOSIS — L84 Corns and callosities: Secondary | ICD-10-CM

## 2023-04-12 DIAGNOSIS — E119 Type 2 diabetes mellitus without complications: Secondary | ICD-10-CM | POA: Diagnosis not present

## 2023-04-12 DIAGNOSIS — R0981 Nasal congestion: Secondary | ICD-10-CM

## 2023-04-12 DIAGNOSIS — E785 Hyperlipidemia, unspecified: Secondary | ICD-10-CM

## 2023-04-12 DIAGNOSIS — E1169 Type 2 diabetes mellitus with other specified complication: Secondary | ICD-10-CM | POA: Diagnosis not present

## 2023-04-12 DIAGNOSIS — Z7984 Long term (current) use of oral hypoglycemic drugs: Secondary | ICD-10-CM

## 2023-04-12 DIAGNOSIS — I10 Essential (primary) hypertension: Secondary | ICD-10-CM

## 2023-04-12 DIAGNOSIS — G4733 Obstructive sleep apnea (adult) (pediatric): Secondary | ICD-10-CM

## 2023-04-12 LAB — POCT GLYCOSYLATED HEMOGLOBIN (HGB A1C): HbA1c, POC (controlled diabetic range): 7 % (ref 0.0–7.0)

## 2023-04-12 MED ORDER — OZEMPIC (0.25 OR 0.5 MG/DOSE) 2 MG/3ML ~~LOC~~ SOPN: 0.5000 mg | PEN_INJECTOR | SUBCUTANEOUS | 1 refills | Status: DC

## 2023-04-12 MED ORDER — CETIRIZINE HCL 10 MG PO TABS: 10.0000 mg | ORAL_TABLET | Freq: Every day | ORAL | 1 refills | Status: DC

## 2023-04-12 MED ORDER — AMLODIPINE BESYLATE 10 MG PO TABS
10.0000 mg | ORAL_TABLET | Freq: Every day | ORAL | 1 refills | Status: DC
Start: 2023-04-12 — End: 2023-10-19

## 2023-04-12 MED ORDER — TERBINAFINE HCL 250 MG PO TABS: 250.0000 mg | ORAL_TABLET | Freq: Every day | ORAL | 2 refills | Status: DC

## 2023-04-12 MED ORDER — CARVEDILOL 25 MG PO TABS
25.0000 mg | ORAL_TABLET | Freq: Two times a day (BID) | ORAL | 1 refills | Status: DC
Start: 2023-04-12 — End: 2023-10-19

## 2023-04-12 MED ORDER — ATORVASTATIN CALCIUM 40 MG PO TABS
40.0000 mg | ORAL_TABLET | Freq: Every day | ORAL | 1 refills | Status: DC
Start: 2023-04-12 — End: 2023-10-19

## 2023-04-12 MED ORDER — LISINOPRIL 40 MG PO TABS
40.0000 mg | ORAL_TABLET | Freq: Every day | ORAL | 1 refills | Status: DC
Start: 2023-04-12 — End: 2023-10-19

## 2023-04-12 MED ORDER — METFORMIN HCL 1000 MG PO TABS: 1000.0000 mg | ORAL_TABLET | Freq: Two times a day (BID) | ORAL | 1 refills | Status: DC

## 2023-04-12 NOTE — Progress Notes (Signed)
Subjective:  Patient ID: Thomas Cummings, male    DOB: 06/27/1965  Age: 57 y.o. MRN: [id number]  CC: Medical Management of Chronic Issues (Runny nose)   HPI Thomas Cummings is a 57 y.o. year old male with a history of hypertension, type 2 diabetes mellitus (A1c 7.0), obesity, obstructive sleep apnea   Interval History: Discussed the use of AI scribe software for clinical note transcription with the patient, who gave verbal consent to proceed.  He presents with multiple concerns. 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 The patient reports no need for pain medication post-surgery and is due for a follow-up visit with the surgeon.  The patient also reports thick and discolored toenails. He expresses discomfort and embarrassment about the appearance of his feet.  Regarding his sleep apnea, the patient has completed a sleep study and is using a CPAP machine regularly, even during travels. He has received several letters related to his sleep apnea treatment, which he brought to the appointment for clarification.  The patient's diabetes has been well-controlled with metformin and Ozempic. However, he admits to a high consumption of sodas, which he believes may be affecting his blood sugar levels. He also acknowledges a lack of adherence to a diabetic diet and exercise regimen.  A1c is 7.0 up from 6.8 previously.  The patient also mentions a stuffy nose, which he experiences particularly when using his CPAP machine. He reports no runny nose, just a feeling of stuffiness. He has not taken any allergy medications for this issue.  Lastly, the patient's blood pressure WAS elevated during the visit. He is currently on amlodipine, carvedilol, and lisinopril for blood pressure management.  He endorses adherence with his antihypertensive.       Past Medical History:  Diagnosis Date   BMI 40.0-44.9, adult (HCC)    Diabetes mellitus due to underlying condition with  hyperosmolarity without coma, without long-term current use of insulin (HCC)    Essential hypertension    Hyperlipidemia    Phreesia 02/19/2020   Hypertension    Phreesia 02/19/2020   Mixed hyperlipidemia    OSA (obstructive sleep apnea)     Past Surgical History:  Procedure Laterality Date   NO PAST SURGERIES      Family History  Problem Relation Age of Onset   Diabetes Mother    Alzheimer's disease Father     Social History   Socioeconomic History   Marital status: Married    Spouse name: Not on file   Number of children: Not on file   Years of education: Not on file   Highest education level: Not on file  Occupational History   Not on file  Tobacco Use   Smoking status: Never    Passive exposure: Never   Smokeless tobacco: Never  Vaping Use   Vaping status: Never Used  Substance and Sexual Activity   Alcohol use: Not Currently   Drug use: Never   Sexual activity: Yes    Partners: Female  Other Topics Concern   Not on file  Social History Narrative   Not on file   Social Determinants of Health   Financial Resource Strain: Low Risk  (04/12/2023)   Overall Financial Resource Strain (CARDIA)    Difficulty of Paying Living Expenses: Not hard at all  Food Insecurity: No Food Insecurity (04/12/2023)   Hunger Vital Sign    Worried About Running Out of Food in the Last Year: Never true    Ran  Out of Food in the Last Year: Never true  Transportation Needs: No Transportation Needs (04/12/2023)   PRAPARE - Administrator, Civil Service (Medical): No    Lack of Transportation (Non-Medical): No  Physical Activity: Inactive (04/12/2023)   Exercise Vital Sign    Days of Exercise per Week: 0 days    Minutes of Exercise per Session: 0 min  Stress: No Stress Concern Present (04/12/2023)   [employer] of Occupational Health - Occupational Stress Questionnaire    Feeling of Stress : Not at all  Social Connections: Moderately Integrated (04/12/2023)    Social Connection and Isolation Panel [NHANES]    Frequency of Communication with Friends and Family: More than three times a week    Frequency of Social Gatherings with Friends and Family: Once a week    Attends Religious Services: More than 4 times per year    Active Member of [employer] or Organizations: No    Attends Banker Meetings: Never    Marital Status: Married    No Known Allergies  Outpatient Medications Prior to Visit  Medication Sig Dispense Refill   blood glucose meter kit and supplies Dispense based on patient and insurance preference. Use up to four times daily as directed. (FOR ICD-10 E10.9, E11.9). 1 each 0   Misc. Devices MISC CPAP machine on AutoPap 5 to 15 cm water.  Diagnosis obstructive sleep apnea 1 each 0   metFORMIN (GLUCOPHAGE) 1000 MG tablet TAKE 1 TABLET BY MOUTH TWICE DAILY WITH A MEAL 60 tablet 0   Semaglutide,0.25 or 0.5MG /DOS, (OZEMPIC, 0.25 OR 0.5 MG/DOSE,) 2 MG/3ML SOPN Inject 0.5 mg into the skin once a week. 3 mL 2   amLODipine (NORVASC) 10 MG tablet Take 1 tablet (10 mg total) by mouth daily. 90 tablet 1   atorvastatin (LIPITOR) 40 MG tablet Take 1 tablet (40 mg total) by mouth daily. 90 tablet 1   carvedilol (COREG) 25 MG tablet Take 1 tablet (25 mg total) by mouth 2 (two) times daily. 180 tablet 1   lisinopril (ZESTRIL) 40 MG tablet Take 1 tablet (40 mg total) by mouth daily. 90 tablet 1   No facility-administered medications prior to visit.     ROS Review of Systems  Constitutional:  Negative for activity change and appetite change.  HENT:  Positive for congestion. Negative for sinus pressure and sore throat.   Respiratory:  Negative for chest tightness, shortness of breath and wheezing.   Cardiovascular:  Negative for chest pain and palpitations.  Gastrointestinal:  Negative for abdominal distention, abdominal pain and constipation.  Genitourinary: Negative.   Musculoskeletal: Negative.   Psychiatric/Behavioral:  Negative for  behavioral problems and dysphoric mood.     Objective:  BP (!) 143/90   Pulse 80   Ht 5\' 11"  (1.803 m)   Wt 265 lb 12.8 oz (120.6 kg)   SpO2 99%   BMI 37.07 kg/m      04/12/2023    9:52 AM 04/12/2023    9:30 AM 10/11/2022    9:44 AM  BP/Weight  Systolic BP 143 145 125  Diastolic BP 90 88 82  Wt. (Lbs)  265.8 265.2  BMI  37.07 kg/m2 36.99 kg/m2      Physical Exam Constitutional:      Appearance: He is well-developed.  HENT:     Head:     Comments: No sinus TTP    Nose: Nose normal.  Cardiovascular:     Rate and Rhythm: Normal rate.  Heart sounds: Normal heart sounds. No murmur heard. Pulmonary:     Effort: Pulmonary effort is normal.     Breath sounds: Normal breath sounds. No wheezing or rales.  Chest:     Chest wall: No tenderness.  Abdominal:     General: Bowel sounds are normal. There is no distension.     Palpations: Abdomen is soft. There is no mass.     Tenderness: There is no abdominal tenderness.  Musculoskeletal:        General: Normal range of motion.     Right lower leg: No edema.     Left lower leg: No edema.     Comments: Left forearm in a cast  Neurological:     Mental Status: He is alert and oriented to person, place, and time.  Psychiatric:        Mood and Affect: Mood normal.    Diabetic Foot Exam - Simple   Simple Foot Form Visual Inspection See comments: Yes Sensation Testing Intact to touch and monofilament testing bilaterally: Yes Pulse Check Posterior Tibialis and Dorsalis pulse intact bilaterally: Yes Comments Onychomycosis of left great toenail.  Callus of lateral aspect of left foot.  No ulceration or skin breakdown.        Latest Ref Rng & Units 10/11/2022   10:43 AM 06/01/2022    4:01 PM 10/16/2021    3:16 PM  CMP  Glucose 70 - 99 mg/dL 96  161  096   BUN 6 - 24 mg/dL 16  14  18    Creatinine 0.76 - 1.27 mg/dL 0.45  4.09  8.11   Sodium 134 - 144 mmol/L 140  143  137   Potassium 3.5 - 5.2 mmol/L 4.2  4.3  3.9    Chloride 96 - 106 mmol/L 104  106  99   CO2 20 - 29 mmol/L 19  20  23    Calcium 8.7 - 10.2 mg/dL 9.7  9.3  9.5   Total Protein 6.0 - 8.5 g/dL 7.3     Total Bilirubin 0.0 - 1.2 mg/dL 0.7     Alkaline Phos 44 - 121 IU/L 128     AST 0 - 40 IU/L 19     ALT 0 - 44 IU/L 26       Lipid Panel     Component Value Date/Time   CHOL 129 10/11/2022 1043   CHOL 159 10/25/2017 0000   TRIG 60 10/11/2022 1043   TRIG 100 10/25/2017 0000   HDL 45 10/11/2022 1043   CHOLHDL 3.2 10/16/2021 1516   CHOLHDL 3.5 10/25/2017 0000   LDLCALC 71 10/11/2022 1043    CBC    Component Value Date/Time   WBC 9.0 05/21/2019 1555   RBC 4.82 05/21/2019 1555   HGB 15.0 05/21/2019 1555   HCT 43.9 05/21/2019 1555   PLT 309 05/21/2019 1555   MCV 91 05/21/2019 1555   MCH 31.1 05/21/2019 1555   MCHC 34.2 05/21/2019 1555   RDW 13.0 05/21/2019 1555    Lab Results  Component Value Date   HGBA1C 7.0 04/12/2023    Assessment & Plan:      Type II Diabetes Mellitus A1c increased slightly from 6.8 to 7.0. Patient admits to high soda intake and lack of exercise. Currently on Metformin and Ozempic. -Continue Metformin and Ozempic. -Encouraged to reduce soda intake and increase physical activity. -Advised to ensure enough Ozempic supply to last through the first two weeks of January due to annual insurance prior authorization. -  Counseled on Diabetic diet, my plate method, 161 minutes of moderate intensity exercise/week Blood sugar logs with fasting goals of 80-120 mg/dl, random of less than 096 and in the event of sugars less than 60 mg/dl or greater than 045 mg/dl encouraged to notify the clinic. Advised on the need for annual eye exams, annual foot exams, Pneumonia vaccine.   Hypertension Blood pressure slightly elevated on repeat measurement. Currently on Amlodipine, Carvedilol, and Lisinopril. -Continue current regimen. -Advised to adhere to a low sodium diet, exercise, and weight loss. -Recheck blood  pressure at next visit.  Sleep Apnea Patient has completed sleep study and is using CPAP machine regularly. Reports improvement in sleep and breathing at night. -Continue CPAP use daily.  Onychomycosis Thickened toenails suggestive of fungal infection. -Prescribe Terbinafine for 30 days with two refills. -Referred to podiatrist due to presence of callus  Nasal Congestion Patient reports frequent nasal congestion, particularly when using CPAP machine. -Prescribe Zyrtec. -Advise over-the-counter saline nasal spray.   General Health Maintenance -Refer for colonoscopy for colon cancer screening.          Meds ordered this encounter  Medications   amLODipine (NORVASC) 10 MG tablet    Sig: Take 1 tablet (10 mg total) by mouth daily.    Dispense:  90 tablet    Refill:  1   atorvastatin (LIPITOR) 40 MG tablet    Sig: Take 1 tablet (40 mg total) by mouth daily.    Dispense:  90 tablet    Refill:  1   carvedilol (COREG) 25 MG tablet    Sig: Take 1 tablet (25 mg total) by mouth 2 (two) times daily.    Dispense:  180 tablet    Refill:  1   lisinopril (ZESTRIL) 40 MG tablet    Sig: Take 1 tablet (40 mg total) by mouth daily.    Dispense:  90 tablet    Refill:  1   metFORMIN (GLUCOPHAGE) 1000 MG tablet    Sig: Take 1 tablet (1,000 mg total) by mouth 2 (two) times daily with a meal.    Dispense:  180 tablet    Refill:  1   Semaglutide,0.25 or 0.5MG /DOS, (OZEMPIC, 0.25 OR 0.5 MG/DOSE,) 2 MG/3ML SOPN    Sig: Inject 0.5 mg into the skin once a week.    Dispense:  9 mL    Refill:  1   terbinafine (LAMISIL) 250 MG tablet    Sig: Take 1 tablet (250 mg total) by mouth daily.    Dispense:  30 tablet    Refill:  2   cetirizine (ZYRTEC) 10 MG tablet    Sig: Take 1 tablet (10 mg total) by mouth daily.    Dispense:  30 tablet    Refill:  1    Follow-up: Return in about 6 months (around 10/11/2023) for Chronic medical conditions.       Hoy Register, MD, FAAFP. [facility] [place], [place] [phone]   04/12/2023, 9:56 AM

## 2023-04-12 NOTE — Telephone Encounter (Signed)
I met with the patient when he was in the clinic today and he shared a letter with me that he received from [facility].  The letter addressed a claim for $98.03 with date of services: 10/19/2022 and requested copies of medical records and a CMN.  The provider was noted as [employer].   I call [employer] : [phone] and spoke to [place] in Billing.  She stated that they have submitted the documents that were requested and are working on this claim.  She said that the patient can disregard the letter and she will follow up with this request.    I called the patient and explained by conversation with [employer].  He stated he understood and appreciated the assistance.

## 2023-04-12 NOTE — Patient Instructions (Signed)
VISIT SUMMARY:  During today's visit, we discussed several of your health concerns, including your diabetes, hypertension, sleep apnea, fungal infection in your toenails, nasal congestion, and general health maintenance. We reviewed your current medications and made some adjustments to your treatment plan to help manage your conditions more effectively.  YOUR PLAN:  -DIABETES MELLITUS: Diabetes is a condition where your blood sugar levels are higher than normal. Your A1c has slightly increased from 6.8 to 7.0. Continue taking Metformin and Ozempic as prescribed. Please reduce your soda intake and increase your physical activity. Ensure you have enough Ozempic to last through the first two weeks of January due to insurance prior authorization.  -HYPERTENSION: Hypertension is high blood pressure. Your blood pressure was slightly elevated today. Continue taking Amlodipine, Carvedilol, and Lisinopril as prescribed. Follow a low sodium diet, exercise regularly, and aim for weight loss. We will recheck your blood pressure at your next visit.  -SLEEP APNEA: Sleep apnea is a condition where your breathing stops and starts during sleep. You are using your CPAP machine regularly, which is good. Please continue using it daily.  -ONYCHOMYCOSIS: Onychomycosis is a fungal infection of the toenails. Your thickened toenails suggest this infection. I have prescribed Terbinafine for 30 days with two refills.  -NASAL CONGESTION: Nasal congestion is a stuffy nose. You experience this particularly when using your CPAP machine. I have prescribed Zyrtec and recommend using an over-the-counter saline nasal spray.  -PODIATRIC CONCERNS: You have a callus on your foot and thickened toenails. I am referring you to podiatry for foot care and nail clipping.  -GENERAL HEALTH MAINTENANCE: For your general health, I am referring you for a colonoscopy to screen for colon cancer.  INSTRUCTIONS:  Please follow up with your  surgeon for your hand surgery as scheduled. Recheck your blood pressure at your next visit. Ensure you have enough Ozempic to last through the first two weeks of January. Follow up with podiatry for foot care and nail clipping. Schedule a colonoscopy for colon cancer screening.

## 2023-04-13 LAB — CMP14+EGFR
ALT: 37 [IU]/L (ref 0–44)
AST: 21 [IU]/L (ref 0–40)
Albumin: 4.2 g/dL (ref 3.8–4.9)
Alkaline Phosphatase: 139 [IU]/L — ABNORMAL HIGH (ref 44–121)
BUN/Creatinine Ratio: 9 (ref 9–20)
BUN: 9 mg/dL (ref 6–24)
Bilirubin Total: 0.8 mg/dL (ref 0.0–1.2)
CO2: 23 mmol/L (ref 20–29)
Calcium: 9.7 mg/dL (ref 8.7–10.2)
Chloride: 103 mmol/L (ref 96–106)
Creatinine, Ser: 1.01 mg/dL (ref 0.76–1.27)
Globulin, Total: 3.3 g/dL (ref 1.5–4.5)
Glucose: 117 mg/dL — ABNORMAL HIGH (ref 70–99)
Potassium: 4.3 mmol/L (ref 3.5–5.2)
Sodium: 140 mmol/L (ref 134–144)
Total Protein: 7.5 g/dL (ref 6.0–8.5)
eGFR: 86 mL/min/{1.73_m2} (ref >59)

## 2023-04-13 LAB — MICROALBUMIN / CREATININE URINE RATIO
Creatinine, Urine: 140.6 mg/dL
Microalb/Creat Ratio: 98 mg/g{creat} — ABNORMAL HIGH (ref 0–29)
Microalbumin, Urine: 138.3 ug/mL

## 2023-04-21 ENCOUNTER — Telehealth: Payer: Self-pay

## 2023-04-21 ENCOUNTER — Other Ambulatory Visit: Payer: Self-pay

## 2023-04-21 DIAGNOSIS — G4733 Obstructive sleep apnea (adult) (pediatric): Secondary | ICD-10-CM | POA: Diagnosis not present

## 2023-04-21 NOTE — Telephone Encounter (Signed)
Pharmacy Patient Advocate Encounter  Received notification from [facility] FEP  that Prior Authorization for [facility] has been APPROVED from 03/20/2023 to 04/18/2024

## 2023-04-27 ENCOUNTER — Ambulatory Visit (INDEPENDENT_AMBULATORY_CARE_PROVIDER_SITE_OTHER): Payer: Federal, State, Local not specified - PPO | Admitting: Podiatry

## 2023-04-27 DIAGNOSIS — Z91199 Patient's noncompliance with other medical treatment and regimen due to unspecified reason: Secondary | ICD-10-CM

## 2023-04-27 NOTE — Progress Notes (Signed)
No show

## 2023-05-16 DIAGNOSIS — G4733 Obstructive sleep apnea (adult) (pediatric): Secondary | ICD-10-CM | POA: Diagnosis not present

## 2023-05-18 DIAGNOSIS — R7309 Other abnormal glucose: Secondary | ICD-10-CM | POA: Diagnosis not present

## 2023-05-21 DIAGNOSIS — G4733 Obstructive sleep apnea (adult) (pediatric): Secondary | ICD-10-CM | POA: Diagnosis not present

## 2023-06-09 ENCOUNTER — Other Ambulatory Visit: Payer: Self-pay | Admitting: Family Medicine

## 2023-06-21 DIAGNOSIS — G4733 Obstructive sleep apnea (adult) (pediatric): Secondary | ICD-10-CM | POA: Diagnosis not present

## 2023-07-18 ENCOUNTER — Telehealth: Payer: Self-pay | Admitting: Family Medicine

## 2023-07-18 DIAGNOSIS — E119 Type 2 diabetes mellitus without complications: Secondary | ICD-10-CM

## 2023-07-18 MED ORDER — OZEMPIC (0.25 OR 0.5 MG/DOSE) 2 MG/3ML ~~LOC~~ SOPN
0.5000 mg | PEN_INJECTOR | SUBCUTANEOUS | 1 refills | Status: DC
Start: 2023-07-18 — End: 2023-10-19

## 2023-07-18 NOTE — Telephone Encounter (Signed)
Copied from CRM [id number]. Topic: Clinical - Prescription Issue >> Jul 18, 2023 11:09 AM Gildardo Pounds wrote: Reason for CRM: Patient has an issue getting his Semaglutide,0.25 or 0.5MG /DOS, (OZEMPIC, 0.25 OR 0.5 MG/DOSE,) 2 MG/3ML SOPN. He needs a 3 month supply using the mail order pharmacy. Callback number is [phone]

## 2023-07-18 NOTE — Telephone Encounter (Signed)
 Rx sent to pharmacy

## 2023-07-22 ENCOUNTER — Telehealth: Payer: Self-pay | Admitting: *Deleted

## 2023-07-22 DIAGNOSIS — G4733 Obstructive sleep apnea (adult) (pediatric): Secondary | ICD-10-CM | POA: Diagnosis not present

## 2023-07-22 NOTE — Telephone Encounter (Signed)
 Dr. Millard team follow up required: Patient need assistance with obtaining Cologuard for CRC screening as in the past. Patient has declined colonoscopy at this time and is requesting Cologuard if Dr. Newlin is in agreement. Please contact patient at [phone]  to discuss Cologuard order and he also has questions regarding mail order medication (Ozempic ). You may also contact Monta Fell BSN, RN,CCM, Health Equity Program Manager (HEPM) at [phone] if additional information needed.    Chart reviewed and Administrator, Civil Service (DPR) verification completed, no DPR on file.    Chart review highlights and potential barriers to colorectal cancer screening (CRC) screening: Per patient's health maintenance, patient is due for Fecal DNA (Cologuard) test as of 12/21/21.  Patient had last Fecal DNA (Cologuard) test completed on 12/22/18 with negative results. Per 05/20/23 GI referral for colonoscopy closure note, patient declined colonoscopy services at this time.    Telephone call to patient, verify patient's name, date of birth, and address. Patient in agreement to outreach and colon cancer screening (CRC) follow up. Discussed patient's colon cancer screening history per chart review.  Patient states the declined colonoscopy because he did not know what it was and does not have the nerve to get it done at this time.  Discussed the different types of CRC Screening test with the specific test frequency and patient voices understanding.  Discussed the importance of screening, early detection, patient voiced understanding, and is in agreement to obtain CRC screening. Patient states he does not have any history of or family history of CRC and is requesting to complete Cologuard as he has in the past. States he will build up the nerve to complete colonoscopy by his next CRC screening in 3 years or sooner if requested by provider. Patient states he had spoken with Dr. Millard office earlier this week regarding  Ozempic  medication refill and has additional questions.  States his wife has picked up medication from the pharmacy for increased price than with previous refills and is requesting a mail order for future refills at a more economical cost. HEPM advised will update Dr. Delbert on this conversation and update his appointment note to advise the team that he would like to obtain kit and requesting call back regarding medication mail order request. States he is very appreciative of HEPM's call and will reach out to HEPM or Dr. Newlin, if he experiences any barriers to [facility] test completion or has not received a call back regarding medication mail order request. No further HEPM interventions needed at this time.   Appointment note updated for 10/11/23 office visit note with Dr. Newlin: Patient is planning to complete Cologuard prior to his 10/11/23 appointment with Dr. Newlin.  He would like to discuss the results of his Cologuard if not discussed prior to this appointment.    Leila Schuff H. Fell BURKITT, CHARITY FUNDRAISER, [facility] [phone]

## 2023-09-05 ENCOUNTER — Other Ambulatory Visit: Payer: Self-pay | Admitting: Family Medicine

## 2023-10-11 ENCOUNTER — Ambulatory Visit: Payer: Federal, State, Local not specified - PPO | Admitting: Family Medicine

## 2023-10-19 ENCOUNTER — Ambulatory Visit: Attending: Family Medicine | Admitting: Family Medicine

## 2023-10-19 ENCOUNTER — Encounter: Payer: Self-pay | Admitting: Family Medicine

## 2023-10-19 VITALS — BP 133/85 | HR 84 | Ht 71.0 in | Wt 268.6 lb

## 2023-10-19 DIAGNOSIS — Z029 Encounter for administrative examinations, unspecified: Secondary | ICD-10-CM

## 2023-10-19 DIAGNOSIS — Z7984 Long term (current) use of oral hypoglycemic drugs: Secondary | ICD-10-CM | POA: Diagnosis not present

## 2023-10-19 DIAGNOSIS — I10 Essential (primary) hypertension: Secondary | ICD-10-CM | POA: Diagnosis not present

## 2023-10-19 DIAGNOSIS — Z7985 Long-term (current) use of injectable non-insulin antidiabetic drugs: Secondary | ICD-10-CM | POA: Diagnosis not present

## 2023-10-19 DIAGNOSIS — E1169 Type 2 diabetes mellitus with other specified complication: Secondary | ICD-10-CM

## 2023-10-19 DIAGNOSIS — E119 Type 2 diabetes mellitus without complications: Secondary | ICD-10-CM

## 2023-10-19 DIAGNOSIS — B351 Tinea unguium: Secondary | ICD-10-CM

## 2023-10-19 DIAGNOSIS — M2060 Acquired deformities of toe(s), unspecified, unspecified foot: Secondary | ICD-10-CM

## 2023-10-19 DIAGNOSIS — Z1211 Encounter for screening for malignant neoplasm of colon: Secondary | ICD-10-CM

## 2023-10-19 DIAGNOSIS — Z23 Encounter for immunization: Secondary | ICD-10-CM | POA: Diagnosis not present

## 2023-10-19 LAB — POCT GLYCOSYLATED HEMOGLOBIN (HGB A1C): HbA1c, POC (controlled diabetic range): 7 % (ref 0.0–7.0)

## 2023-10-19 MED ORDER — AMLODIPINE BESYLATE 10 MG PO TABS
10.0000 mg | ORAL_TABLET | Freq: Every day | ORAL | 1 refills | Status: DC
Start: 2023-10-19 — End: 2024-04-30

## 2023-10-19 MED ORDER — OZEMPIC (0.25 OR 0.5 MG/DOSE) 2 MG/3ML ~~LOC~~ SOPN
0.5000 mg | PEN_INJECTOR | SUBCUTANEOUS | 1 refills | Status: DC
Start: 2023-10-19 — End: 2023-10-24

## 2023-10-19 MED ORDER — ATORVASTATIN CALCIUM 40 MG PO TABS: 40.0000 mg | ORAL_TABLET | Freq: Every day | ORAL | 1 refills | Status: DC

## 2023-10-19 MED ORDER — TERBINAFINE HCL 250 MG PO TABS: 250.0000 mg | ORAL_TABLET | Freq: Every day | ORAL | 2 refills | Status: DC

## 2023-10-19 MED ORDER — METFORMIN HCL 1000 MG PO TABS
1000.0000 mg | ORAL_TABLET | Freq: Two times a day (BID) | ORAL | 1 refills | Status: DC
Start: 2023-10-19 — End: 2024-04-30

## 2023-10-19 MED ORDER — CARVEDILOL 25 MG PO TABS
25.0000 mg | ORAL_TABLET | Freq: Two times a day (BID) | ORAL | 1 refills | Status: DC
Start: 2023-10-19 — End: 2024-04-30

## 2023-10-19 MED ORDER — LISINOPRIL 40 MG PO TABS
40.0000 mg | ORAL_TABLET | Freq: Every day | ORAL | 1 refills | Status: DC
Start: 2023-10-19 — End: 2024-04-30

## 2023-10-19 NOTE — Patient Instructions (Signed)
 VISIT SUMMARY:  Today, you had a routine wellness visit and a foster care evaluation. We discussed the requirements for your foster care application, including the TB skin test. You are managing your hypertension, diabetes, and high cholesterol well. We also talked about restarting treatment for your toenail fungus and your preference for a stool test for colorectal cancer screening. Additionally, we reviewed your recent eye exam and discussed potential eye surgery.  YOUR PLAN:  -WELLNESS VISIT: This was a routine check-up to review your overall health and discuss your foster care application. We will order a Cologuard test for colorectal cancer screening, administer a pneumonia vaccine, perform a foot exam, and request your eye exam records from Dr. Donalynn Fry.  -TYPE 2 DIABETES MELLITUS: Type 2 diabetes is a condition where your body does not use insulin properly, leading to high blood sugar levels. Your diabetes is well-controlled with an A1c of 7.0. Continue taking Metformin  1000 mg twice daily and Semaglutide  (Ozempic ) 0.5 mg once a week. We will also check your kidney and liver function.  -HYPERTENSION: Hypertension, or high blood pressure, is when the force of your blood against your artery walls is too high. Your blood pressure is stable. Continue taking Amlodipine  10 mg daily, Carvedilol  25 mg twice daily, and Lisinopril  40 mg daily.  -ONYCHOMYCOSIS: Onychomycosis is a fungal infection of the toenails. We will restart your treatment with an antifungal medication, which will be sent to your pharmacy.  -HAMMER TOES: Hammer toes are toes that are bent due to a muscle imbalance. To prevent sores, wear shoes with adequate room.  INSTRUCTIONS:  Please complete your blood tests today, except for the cholesterol test, which should be done when you are fasting. Follow up with the ophthalmologist for a consultation about potential eye surgery. We will also administer the TB skin test as part of  your foster care evaluation.

## 2023-10-19 NOTE — Progress Notes (Signed)
 Subjective:  Patient ID: Thomas Cummings, male    DOB: 08/07/1965  Age: 58 y.o. MRN: [id number]  CC: Medical Management of Chronic Issues     Discussed the use of AI scribe software for clinical note transcription with the patient, who gave verbal consent to proceed.  History of Present Illness Thomas Cummings is a 58 year old male hypertension, type 2 diabetes mellitus (A1c 7.0), obesity, obstructive sleep apnea who presents for a foster care evaluation and routine follow-up.  He is ready to proceed with the foster care evaluation, and is needing a form completed to clear him to proceed with this.   He manages hypertension with amlodipine , carvedilol , and lisinopril , diabetes with metformin  and Ozempic , and high cholesterol with atorvastatin . His A1c is 7.0 and he denies presence of visual concerns or neuropathy.  He is up-to-date with annual eye exams which he had done at [facility] eye care, [facility]. He plans to complete blood tests today, except for cholesterol, which he will do when fasting.  He requests to restart treatment for toenail fungus as he did not completely adhere to the previous prescription regimen.  He prefers a stool test for colorectal cancer screening. He has no depression, anxiety, drug or alcohol use.    Past Medical History:  Diagnosis Date   BMI 40.0-44.9, adult (HCC)    Diabetes mellitus due to underlying condition with hyperosmolarity without coma, without long-term current use of insulin (HCC)    Essential hypertension    Hyperlipidemia    Phreesia 02/19/2020   Hypertension    Phreesia 02/19/2020   Mixed hyperlipidemia    OSA (obstructive sleep apnea)     Past Surgical History:  Procedure Laterality Date   NO PAST SURGERIES      Family History  Problem Relation Age of Onset   Diabetes Mother    Alzheimer's disease Father     Social History   Socioeconomic History   Marital status: Married    Spouse name: Not on file   Number of  children: Not on file   Years of education: Not on file   Highest education level: Not on file  Occupational History   Not on file  Tobacco Use   Smoking status: Never    Passive exposure: Never   Smokeless tobacco: Never  Vaping Use   Vaping status: Never Used  Substance and Sexual Activity   Alcohol use: Not Currently   Drug use: Never   Sexual activity: Yes    Partners: Female  Other Topics Concern   Not on file  Social History Narrative   Not on file   Social Drivers of Health   Financial Resource Strain: Low Risk  (04/12/2023)   Overall Financial Resource Strain (CARDIA)    Difficulty of Paying Living Expenses: Not hard at all  Food Insecurity: No Food Insecurity (04/12/2023)   Hunger Vital Sign    Worried About Running Out of Food in the Last Year: Never true    Ran Out of Food in the Last Year: Never true  Transportation Needs: No Transportation Needs (04/12/2023)   PRAPARE - Administrator, Civil Service (Medical): No    Lack of Transportation (Non-Medical): No  Physical Activity: Inactive (04/12/2023)   Exercise Vital Sign    Days of Exercise per Week: 0 days    Minutes of Exercise per Session: 0 min  Stress: No Stress Concern Present (04/12/2023)   [employer] of Occupational Health - Occupational Stress Questionnaire  Feeling of Stress : Not at all  Social Connections: Moderately Integrated (04/12/2023)   Social Connection and Isolation Panel [NHANES]    Frequency of Communication with Friends and Family: More than three times a week    Frequency of Social Gatherings with Friends and Family: Once a week    Attends Religious Services: More than 4 times per year    Active Member of [employer] or Organizations: No    Attends Banker Meetings: Never    Marital Status: Married    No Known Allergies  Outpatient Medications Prior to Visit  Medication Sig Dispense Refill   blood glucose meter kit and supplies Dispense based on  patient and insurance preference. Use up to four times daily as directed. (FOR ICD-10 E10.9, E11.9). 1 each 0   EQ ALLERGY RELIEF, CETIRIZINE , 10 MG tablet Take 1 tablet by mouth once daily 90 tablet 0   metFORMIN  (GLUCOPHAGE ) 1000 MG tablet Take 1 tablet (1,000 mg total) by mouth 2 (two) times daily with a meal. 180 tablet 1   Misc. Devices MISC CPAP machine on AutoPap 5 to 15 cm water.  Diagnosis obstructive sleep apnea 1 each 0   Semaglutide ,0.25 or 0.5MG /DOS, (OZEMPIC , 0.25 OR 0.5 MG/DOSE,) 2 MG/3ML SOPN Inject 0.5 mg into the skin once a week. 36 mL 1   terbinafine  (LAMISIL ) 250 MG tablet Take 1 tablet (250 mg total) by mouth daily. 30 tablet 2   amLODipine  (NORVASC ) 10 MG tablet Take 1 tablet (10 mg total) by mouth daily. 90 tablet 1   atorvastatin  (LIPITOR) 40 MG tablet Take 1 tablet (40 mg total) by mouth daily. 90 tablet 1   carvedilol  (COREG ) 25 MG tablet Take 1 tablet (25 mg total) by mouth 2 (two) times daily. 180 tablet 1   lisinopril  (ZESTRIL ) 40 MG tablet Take 1 tablet (40 mg total) by mouth daily. 90 tablet 1   No facility-administered medications prior to visit.     ROS Review of Systems  Constitutional:  Negative for activity change and appetite change.  HENT:  Negative for sinus pressure and sore throat.   Respiratory:  Negative for chest tightness, shortness of breath and wheezing.   Cardiovascular:  Negative for chest pain and palpitations.  Gastrointestinal:  Negative for abdominal distention, abdominal pain and constipation.  Genitourinary: Negative.   Musculoskeletal: Negative.   Psychiatric/Behavioral:  Negative for behavioral problems and dysphoric mood.     Objective:  BP 133/85   Pulse 84   Ht 5\' 11"  (1.803 m)   Wt 268 lb 9.6 oz (121.8 kg)   SpO2 98%   BMI 37.46 kg/m      10/19/2023    9:51 AM 04/12/2023    9:52 AM 04/12/2023    9:30 AM  BP/Weight  Systolic BP 133 143 145  Diastolic BP 85 90 88  Wt. (Lbs) 268.6  265.8  BMI 37.46 kg/m2  37.07  kg/m2      Physical Exam Constitutional:      Appearance: He is well-developed.  Cardiovascular:     Rate and Rhythm: Normal rate.     Heart sounds: Normal heart sounds. No murmur heard. Pulmonary:     Effort: Pulmonary effort is normal.     Breath sounds: Normal breath sounds. No wheezing or rales.  Chest:     Chest wall: No tenderness.  Abdominal:     General: Bowel sounds are normal. There is no distension.     Palpations: Abdomen is soft. There is no  mass.     Tenderness: There is no abdominal tenderness.  Musculoskeletal:        General: Normal range of motion.     Right lower leg: No edema.     Left lower leg: No edema.  Neurological:     Mental Status: He is alert and oriented to person, place, and time.  Psychiatric:        Mood and Affect: Mood normal.        Latest Ref Rng & Units 04/12/2023   10:02 AM 10/11/2022   10:43 AM 06/01/2022    4:01 PM  CMP  Glucose 70 - 99 mg/dL 409  96  811   BUN 6 - 24 mg/dL 9  16  14    Creatinine 0.76 - 1.27 mg/dL 9.14  7.82  9.56   Sodium 134 - 144 mmol/L 140  140  143   Potassium 3.5 - 5.2 mmol/L 4.3  4.2  4.3   Chloride 96 - 106 mmol/L 103  104  106   CO2 20 - 29 mmol/L 23  19  20    Calcium  8.7 - 10.2 mg/dL 9.7  9.7  9.3   Total Protein 6.0 - 8.5 g/dL 7.5  7.3    Total Bilirubin 0.0 - 1.2 mg/dL 0.8  0.7    Alkaline Phos 44 - 121 IU/L 139  128    AST 0 - 40 IU/L 21  19    ALT 0 - 44 IU/L 37  26      Lipid Panel     Component Value Date/Time   CHOL 129 10/11/2022 1043   CHOL 159 10/25/2017 0000   TRIG 60 10/11/2022 1043   TRIG 100 10/25/2017 0000   HDL 45 10/11/2022 1043   CHOLHDL 3.2 10/16/2021 1516   CHOLHDL 3.5 10/25/2017 0000   LDLCALC 71 10/11/2022 1043    CBC    Component Value Date/Time   WBC 9.0 05/21/2019 1555   RBC 4.82 05/21/2019 1555   HGB 15.0 05/21/2019 1555   HCT 43.9 05/21/2019 1555   PLT 309 05/21/2019 1555   MCV 91 05/21/2019 1555   MCH 31.1 05/21/2019 1555   MCHC 34.2 05/21/2019 1555    RDW 13.0 05/21/2019 1555    Lab Results  Component Value Date   HGBA1C 7.0 10/19/2023       Assessment & Plan Type 2 Diabetes Mellitus Type 2 diabetes well-controlled. Recent A1c is 7.0. No significant neuropathy symptoms. - Continue Metformin  1000 mg oral bid. - Continue Semaglutide  (Ozempic ) 0.5 mg subcutaneous weekly. - Check kidney and liver function tests. -Counseled on Diabetic diet, my plate method, 213 minutes of moderate intensity exercise/week Blood sugar logs with fasting goals of 80-120 mg/dl, random of less than 086 and in the event of sugars less than 60 mg/dl or greater than 578 mg/dl encouraged to notify the clinic. Advised on the need for annual eye exams, annual foot exams, Pneumonia vaccine.   Healthcare maintenance Routine wellness visit. Discussed foster care application requirements and completed form - Order Cologuard for colorectal cancer screening. - Administer pneumonia vaccine. - Perform foot exam. - Send message to Dr. Donalynn Fry for eye exam records.  Hypertension Hypertension well-controlled. Blood pressure stable. - Continue Amlodipine  10 mg oral daily. - Continue Carvedilol  25 mg oral bid. - Continue Lisinopril  40 mg oral daily. -Counseled on blood pressure goal of less than 130/80, low-sodium, DASH diet, medication compliance, 150 minutes of moderate intensity exercise per week. Discussed medication compliance, adverse  effects.   Onychomycosis Onychomycosis treatment not completed. Requests to restart treatment. - Send prescription for antifungal medication to pharmacy.  Hammer Toes Presence of hammer toes noted. Advised on footwear to prevent sores. - Advise wearing shoes with adequate room.      No orders of the defined types were placed in this encounter.   Follow-up: No follow-ups on file.       Joaquin Mulberry, MD, FAAFP. [facility] [place], [place] [phone]   10/19/2023,  10:10 AM

## 2023-10-20 LAB — CBC WITH DIFFERENTIAL/PLATELET
Basophils Absolute: 0.1 10*3/uL (ref 0.0–0.2)
Basos: 1 %
EOS (ABSOLUTE): 0.1 10*3/uL (ref 0.0–0.4)
Eos: 2 %
Hematocrit: 45.9 % (ref 37.5–51.0)
Hemoglobin: 15.5 g/dL (ref 13.0–17.7)
Immature Grans (Abs): 0 10*3/uL (ref 0.0–0.1)
Immature Granulocytes: 0 %
Lymphocytes Absolute: 2.9 10*3/uL (ref 0.7–3.1)
Lymphs: 35 %
MCH: 29.9 pg (ref 26.6–33.0)
MCHC: 33.8 g/dL (ref 31.5–35.7)
MCV: 89 fL (ref 79–97)
Monocytes Absolute: 0.4 10*3/uL (ref 0.1–0.9)
Monocytes: 5 %
Neutrophils Absolute: 4.6 10*3/uL (ref 1.4–7.0)
Neutrophils: 57 %
Platelets: 311 10*3/uL (ref 150–450)
RBC: 5.18 x10E6/uL (ref 4.14–5.80)
RDW: 12.9 % (ref 11.6–15.4)
WBC: 8.1 10*3/uL (ref 3.4–10.8)

## 2023-10-20 LAB — CMP14+EGFR
ALT: 31 IU/L (ref 0–44)
AST: 21 IU/L (ref 0–40)
Albumin: 4.2 g/dL (ref 3.8–4.9)
Alkaline Phosphatase: 143 IU/L — ABNORMAL HIGH (ref 44–121)
BUN/Creatinine Ratio: 14 (ref 9–20)
BUN: 14 mg/dL (ref 6–24)
Bilirubin Total: 0.8 mg/dL (ref 0.0–1.2)
CO2: 21 mmol/L (ref 20–29)
Calcium: 9.6 mg/dL (ref 8.7–10.2)
Chloride: 100 mmol/L (ref 96–106)
Creatinine, Ser: 1 mg/dL (ref 0.76–1.27)
Globulin, Total: 3.5 g/dL (ref 1.5–4.5)
Glucose: 104 mg/dL — ABNORMAL HIGH (ref 70–99)
Potassium: 4.1 mmol/L (ref 3.5–5.2)
Sodium: 137 mmol/L (ref 134–144)
Total Protein: 7.7 g/dL (ref 6.0–8.5)
eGFR: 87 mL/min/{1.73_m2} (ref >59)

## 2023-10-21 ENCOUNTER — Telehealth: Payer: Self-pay | Admitting: Family Medicine

## 2023-10-21 NOTE — Telephone Encounter (Signed)
 Copied from CRM [id number]. Topic: Clinical - Prescription Issue >> Oct 21, 2023  4:31 PM Santiya F wrote: Reason for CRM: Patient is calling in because his refill Semaglutide ,0.25 or 0.5MG /DOS, (OZEMPIC , 0.25 OR 0.5 MG/DOSE,) 2 MG/3ML SOPN [[id number]] was sent to [facility] and it should've been sent to [facility] due to the cost difference.

## 2023-10-24 ENCOUNTER — Other Ambulatory Visit: Payer: Self-pay

## 2023-10-24 DIAGNOSIS — E119 Type 2 diabetes mellitus without complications: Secondary | ICD-10-CM

## 2023-10-24 MED ORDER — OZEMPIC (0.25 OR 0.5 MG/DOSE) 2 MG/3ML ~~LOC~~ SOPN: 0.5000 mg | PEN_INJECTOR | SUBCUTANEOUS | 1 refills | Status: DC

## 2023-10-24 NOTE — Telephone Encounter (Signed)
 Medication has been sent to pharmacy.

## 2023-10-25 ENCOUNTER — Ambulatory Visit: Payer: Self-pay

## 2023-10-25 DIAGNOSIS — R195 Other fecal abnormalities: Secondary | ICD-10-CM

## 2023-11-13 LAB — COLOGUARD

## 2023-12-04 LAB — COLOGUARD: COLOGUARD: POSITIVE — AB

## 2023-12-07 ENCOUNTER — Encounter: Payer: Self-pay | Admitting: Pediatrics

## 2023-12-07 ENCOUNTER — Other Ambulatory Visit: Payer: Self-pay

## 2023-12-07 ENCOUNTER — Emergency Department (HOSPITAL_COMMUNITY)
Admission: EM | Admit: 2023-12-07 | Discharge: 2023-12-08 | Disposition: A | Discharge status: Home / Self Care | Attending: Emergency Medicine | Admitting: Emergency Medicine

## 2023-12-07 ENCOUNTER — Emergency Department (HOSPITAL_COMMUNITY)

## 2023-12-07 DIAGNOSIS — K1120 Sialoadenitis, unspecified: Secondary | ICD-10-CM | POA: Diagnosis not present

## 2023-12-07 DIAGNOSIS — K111 Hypertrophy of salivary gland: Secondary | ICD-10-CM | POA: Diagnosis not present

## 2023-12-07 DIAGNOSIS — R220 Localized swelling, mass and lump, head: Secondary | ICD-10-CM | POA: Diagnosis not present

## 2023-12-07 DIAGNOSIS — R221 Localized swelling, mass and lump, neck: Secondary | ICD-10-CM | POA: Diagnosis not present

## 2023-12-07 LAB — CBC WITH DIFFERENTIAL/PLATELET
Abs Immature Granulocytes: 0.03 10*3/uL (ref 0.00–0.07)
Basophils Absolute: 0.1 10*3/uL (ref 0.0–0.1)
Basophils Relative: 1 %
Eosinophils Absolute: 0.1 10*3/uL (ref 0.0–0.5)
Eosinophils Relative: 1 %
HCT: 45.2 % (ref 39.0–52.0)
Hemoglobin: 15.5 g/dL (ref 13.0–17.0)
Immature Granulocytes: 0 %
Lymphocytes Relative: 31 %
Lymphs Abs: 3.1 10*3/uL (ref 0.7–4.0)
MCH: 30.4 pg (ref 26.0–34.0)
MCHC: 34.3 g/dL (ref 30.0–36.0)
MCV: 88.6 fL (ref 80.0–100.0)
Monocytes Absolute: 0.8 10*3/uL (ref 0.1–1.0)
Monocytes Relative: 8 %
Neutro Abs: 5.9 10*3/uL (ref 1.7–7.7)
Neutrophils Relative %: 59 %
Platelets: 310 10*3/uL (ref 150–400)
RBC: 5.1 MIL/uL (ref 4.22–5.81)
RDW: 13.6 % (ref 11.5–15.5)
WBC: 10 10*3/uL (ref 4.0–10.5)
nRBC: 0 % (ref 0.0–0.2)

## 2023-12-07 LAB — COMPREHENSIVE METABOLIC PANEL WITH GFR
ALT: 29 U/L (ref 0–44)
AST: 23 U/L (ref 15–41)
Albumin: 3.6 g/dL (ref 3.5–5.0)
Alkaline Phosphatase: 102 U/L (ref 38–126)
Anion gap: 12 (ref 5–15)
BUN: 11 mg/dL (ref 6–20)
CO2: 22 mmol/L (ref 22–32)
Calcium: 9.1 mg/dL (ref 8.9–10.3)
Chloride: 105 mmol/L (ref 98–111)
Creatinine, Ser: 1.05 mg/dL (ref 0.61–1.24)
GFR, Estimated: >60 mL/min (ref >60)
Glucose, Bld: 127 mg/dL — ABNORMAL HIGH (ref 70–99)
Potassium: 3.1 mmol/L — ABNORMAL LOW (ref 3.5–5.1)
Sodium: 139 mmol/L (ref 135–145)
Total Bilirubin: 0.9 mg/dL (ref 0.0–1.2)
Total Protein: 7.8 g/dL (ref 6.5–8.1)

## 2023-12-07 NOTE — ED Provider Triage Note (Signed)
 Emergency Medicine Provider Triage Evaluation Note  Winner Valeriano , a 58 y.o. male  was evaluated in triage.  Pt complains of left sided facial swelling began this afternoon, wears dentures on the top and bottom.  Tenderness to palpation along the parotid gland.  Small pinpoint white dot on the inside, likely coming from an obstructed parotid duct.  Review of Systems  Positive: Facial swelling Negative: Trouble tolerating his secretions  Physical Exam  There were no vitals taken for this visit. Gen:   Awake, no distress   Resp:  Normal effort  MSK:   Moves extremities without difficulty  Other:  Left facial swelling.  Extending all the way down to the left mastoid.  Small white pinpoint inside his mouth around the parotid duct.  Medical Decision Making  Medically screening exam initiated at 9:32 PM.  Appropriate orders placed.  Zyiere Rosemond was informed that the remainder of the evaluation will be completed by another provider, this initial triage assessment does not replace that evaluation, and the importance of remaining in the ED until their evaluation is complete.  Suspect Sialadenitis, but will look for deep space infection. No trouble with tolerating his secretions, no trismus, no tachypnea, no respiratory distress.   Porter Nakama, PA-C 12/07/23 2137

## 2023-12-07 NOTE — ED Triage Notes (Signed)
 Patient reports swelling at left lateral neck /left upper jaw onset this morning , denies injury , no oral swelling, airway intact , no fever or chills .

## 2023-12-08 MED ORDER — OXYCODONE-ACETAMINOPHEN 5-325 MG PO TABS
1.0000 | ORAL_TABLET | Freq: Once | ORAL | Status: AC
  Administered 2023-12-08: 1 via ORAL
  Filled 2023-12-08: qty 1

## 2023-12-08 MED ORDER — TRAMADOL HCL 50 MG PO TABS: 50.0000 mg | ORAL_TABLET | Freq: Four times a day (QID) | ORAL | 0 refills | Status: DC | PRN

## 2023-12-08 NOTE — ED Notes (Signed)
Pt verbalized understanding of discharge instructions. Pt ambulated from ed with steady gait.  

## 2023-12-08 NOTE — Discharge Instructions (Addendum)
 Take the prescribed medication as directed for pain.  Can augment this with tylenol /motrin as needed. Recommend staying hydrated, warm compresses to the area, gentle massage, and sucking on hard candy to increase saliva production (lemon drops work well for this). Follow-up with ENT if not improving. Return to the ED for new or worsening symptoms-- trouble swallowing or breathing, etc.

## 2023-12-08 NOTE — ED Provider Notes (Signed)
 [place] EMERGENCY DEPARTMENT AT [facility] Provider Note   CSN: [id number] Arrival date & time: 12/07/23  2049     Patient presents with: Left Neck Swelling   Thomas Cummings is a 58 y.o. male.   The history is provided by the patient and medical records.   58 year old male presenting to the ED with left facial swelling.  States began yesterday afternoon around 3 PM had sudden onset of swelling to the left side of his face.  States it was small at first but progressively got larger.  States he just feels tight and tense on the left side of his face but no significant pain.  Denies any trouble swallowing, pain in the teeth, fevers, nausea, or vomiting.  States he has never had any symptoms like this before.  Denies excessive dry mouth, etc.  Does wear dentures but no issues with these recently.  Prior to Admission medications   Medication Sig Start Date End Date Taking? Authorizing Provider  traMADol  (ULTRAM ) 50 MG tablet Take 1 tablet (50 mg total) by mouth every 6 (six) hours as needed. 12/08/23  Yes Jarold Olam HERO, PA-C  amLODipine  (NORVASC ) 10 MG tablet Take 1 tablet (10 mg total) by mouth daily. 10/19/23 02/16/24  Newlin, Enobong, MD  atorvastatin  (LIPITOR) 40 MG tablet Take 1 tablet (40 mg total) by mouth daily. 10/19/23 04/16/24  Newlin, Enobong, MD  blood glucose meter kit and supplies Dispense based on patient and insurance preference. Use up to four times daily as directed. (FOR ICD-10 E10.9, E11.9). 01/16/21   Waldron Pac, FNP  carvedilol  (COREG ) 25 MG tablet Take 1 tablet (25 mg total) by mouth 2 (two) times daily. 10/19/23 02/16/24  Newlin, Enobong, MD  EQ ALLERGY RELIEF, CETIRIZINE , 10 MG tablet Take 1 tablet by mouth once daily 09/05/23   Newlin, Enobong, MD  lisinopril  (ZESTRIL ) 40 MG tablet Take 1 tablet (40 mg total) by mouth daily. 10/19/23 02/16/24  Newlin, Enobong, MD  metFORMIN  (GLUCOPHAGE ) 1000 MG tablet Take 1 tablet (1,000 mg total) by mouth 2 (two) times daily with  a meal. 10/19/23   Delbert Clam, MD  Misc. Devices MISC CPAP machine on AutoPap 5 to 15 cm water.  Diagnosis obstructive sleep apnea 10/11/22   Delbert Clam, MD  Semaglutide ,0.25 or 0.5MG /DOS, (OZEMPIC , 0.25 OR 0.5 MG/DOSE,) 2 MG/3ML SOPN Inject 0.5 mg into the skin once a week. 10/24/23   Newlin, Enobong, MD  terbinafine  (LAMISIL ) 250 MG tablet Take 1 tablet (250 mg total) by mouth daily. 10/19/23   Newlin, Enobong, MD    Allergies: Patient has no known allergies.    Review of Systems  HENT:  Positive for facial swelling.   All other systems reviewed and are negative.   Updated Vital Signs BP (!) 144/111   Pulse 90   Temp 98.3 F (36.8 C) (Oral)   Resp 16   SpO2 100%   Physical Exam Vitals and nursing note reviewed.  Constitutional:      Appearance: He is well-developed.  HENT:     Head: Normocephalic and atraumatic.     Comments: Swelling to left side of face spanning to preauricular area and down to the mandible, there is no fluctuance    Mouth/Throat:     Comments: Dentures in place Handling secretions well, normal phonation, no stridor  Eyes:     Conjunctiva/sclera: Conjunctivae normal.     Pupils: Pupils are equal, round, and reactive to light.    Cardiovascular:     Rate  and Rhythm: Normal rate and regular rhythm.     Heart sounds: Normal heart sounds.  Pulmonary:     Effort: Pulmonary effort is normal.     Breath sounds: Normal breath sounds.  Abdominal:     General: Bowel sounds are normal.     Palpations: Abdomen is soft.   Musculoskeletal:        General: Normal range of motion.     Cervical back: Normal range of motion.   Skin:    General: Skin is warm and dry.   Neurological:     Mental Status: He is alert and oriented to person, place, and time.     (all labs ordered are listed, but only abnormal results are displayed) Labs Reviewed  COMPREHENSIVE METABOLIC PANEL WITH GFR - Abnormal; Notable for the following components:      Result Value    Potassium 3.1 (*)    Glucose, Bld 127 (*)    All other components within normal limits  CBC WITH DIFFERENTIAL/PLATELET    EKG: None  Radiology: CT Maxillofacial Wo Contrast Result Date: 12/07/2023 CLINICAL DATA:  Left neck swelling. EXAM: CT MAXILLOFACIAL WITHOUT CONTRAST TECHNIQUE: Multidetector CT imaging of the maxillofacial structures was performed. Multiplanar CT image reconstructions were also generated. RADIATION DOSE REDUCTION: This exam was performed according to the departmental dose-optimization program which includes automated exposure control, adjustment of the mA and/or kV according to patient size and/or use of iterative reconstruction technique. COMPARISON:  None Available. FINDINGS: Osseous: No fracture or mandibular dislocation. No destructive process. Orbits: Negative. No traumatic or inflammatory finding. Sinuses: Clear Soft tissues: There is enlargement of the left parotid gland with surrounding inflammation compatible with parotitis. Remainder of the soft tissues unremarkable. Limited intracranial: No significant or unexpected finding. IMPRESSION: Enlargement of the left parotid gland with surrounding inflammation compatible with parotitis. Electronically Signed   By: Franky Crease M.D.   On: 12/07/2023 22:31     Procedures   Medications Ordered in the ED  oxyCODONE -acetaminophen  (PERCOCET/ROXICET) 5-325 MG per tablet 1 tablet (1 tablet Oral Given 12/08/23 0458)                                    Medical Decision Making Amount and/or Complexity of Data Reviewed Radiology: ordered and independent interpretation performed. ECG/medicine tests: ordered and independent interpretation performed.  Risk Prescription drug management.   58 year old male presenting to the ED with left-sided facial swelling that began yesterday abruptly.  Denies any distal pain.  No difficulty swallowing.  No shortness of breath.  He is afebrile and nontoxic.  Does have left-sided facial  swelling around the parotid gland into the preauricular area and down to the mandible.  No apparent dental involvement.  There is no fluctuance or drainage.  Labs without leukocytosis or electrolyte derangement.  CT soft tissue obtained from triage and reviewed, consistent with parotitis.  This correlates clinically on exam.  She is not have any fever, leukocytosis, or other infectious symptoms so do not suspect this is bacterial.  Suspect he has a obstructed salivary duct.  Encouraged warm compresses, gentle massage, eating hard candy such as lemon drops to help increase saliva production.  Can follow-up with ENT if ongoing issues.  Return here for new concerns.  Final diagnoses:  Parotitis    ED Discharge Orders          Ordered    traMADol  (ULTRAM ) 50 MG tablet  Every 6 hours PRN        12/08/23 0449               Jarold Olam HERO, PA-C 12/08/23 9372    Bari Charmaine FALCON, MD 12/09/23 [phone]

## 2023-12-09 ENCOUNTER — Encounter (HOSPITAL_COMMUNITY): Payer: Self-pay

## 2023-12-09 ENCOUNTER — Emergency Department (HOSPITAL_COMMUNITY)

## 2023-12-09 ENCOUNTER — Emergency Department (HOSPITAL_COMMUNITY)
Admission: EM | Admit: 2023-12-09 | Discharge: 2023-12-09 | Disposition: A | Discharge status: Home / Self Care | Attending: Emergency Medicine | Admitting: Emergency Medicine

## 2023-12-09 ENCOUNTER — Other Ambulatory Visit: Payer: Self-pay

## 2023-12-09 ENCOUNTER — Ambulatory Visit: Payer: Self-pay

## 2023-12-09 DIAGNOSIS — E119 Type 2 diabetes mellitus without complications: Secondary | ICD-10-CM | POA: Insufficient documentation

## 2023-12-09 DIAGNOSIS — K111 Hypertrophy of salivary gland: Secondary | ICD-10-CM | POA: Diagnosis not present

## 2023-12-09 DIAGNOSIS — Z87891 Personal history of nicotine dependence: Secondary | ICD-10-CM | POA: Insufficient documentation

## 2023-12-09 DIAGNOSIS — J320 Chronic maxillary sinusitis: Secondary | ICD-10-CM | POA: Diagnosis not present

## 2023-12-09 DIAGNOSIS — Z7984 Long term (current) use of oral hypoglycemic drugs: Secondary | ICD-10-CM | POA: Insufficient documentation

## 2023-12-09 DIAGNOSIS — K1120 Sialoadenitis, unspecified: Secondary | ICD-10-CM | POA: Insufficient documentation

## 2023-12-09 DIAGNOSIS — I10 Essential (primary) hypertension: Secondary | ICD-10-CM | POA: Insufficient documentation

## 2023-12-09 DIAGNOSIS — E041 Nontoxic single thyroid nodule: Secondary | ICD-10-CM | POA: Diagnosis not present

## 2023-12-09 DIAGNOSIS — R220 Localized swelling, mass and lump, head: Secondary | ICD-10-CM | POA: Diagnosis not present

## 2023-12-09 LAB — CBC WITH DIFFERENTIAL/PLATELET
Abs Immature Granulocytes: 0.05 10*3/uL (ref 0.00–0.07)
Basophils Absolute: 0 10*3/uL (ref 0.0–0.1)
Basophils Relative: 0 %
Eosinophils Absolute: 0.1 10*3/uL (ref 0.0–0.5)
Eosinophils Relative: 1 %
HCT: 48.2 % (ref 39.0–52.0)
Hemoglobin: 15.8 g/dL (ref 13.0–17.0)
Immature Granulocytes: 0 %
Lymphocytes Relative: 17 %
Lymphs Abs: 2.2 10*3/uL (ref 0.7–4.0)
MCH: 30.2 pg (ref 26.0–34.0)
MCHC: 32.8 g/dL (ref 30.0–36.0)
MCV: 92 fL (ref 80.0–100.0)
Monocytes Absolute: 1.3 10*3/uL — ABNORMAL HIGH (ref 0.1–1.0)
Monocytes Relative: 10 %
Neutro Abs: 9.3 10*3/uL — ABNORMAL HIGH (ref 1.7–7.7)
Neutrophils Relative %: 72 %
Platelets: 317 10*3/uL (ref 150–400)
RBC: 5.24 MIL/uL (ref 4.22–5.81)
RDW: 13.5 % (ref 11.5–15.5)
WBC: 13 10*3/uL — ABNORMAL HIGH (ref 4.0–10.5)
nRBC: 0 % (ref 0.0–0.2)

## 2023-12-09 LAB — BASIC METABOLIC PANEL WITH GFR
Anion gap: 10 (ref 5–15)
BUN: 12 mg/dL (ref 6–20)
CO2: 26 mmol/L (ref 22–32)
Calcium: 9.3 mg/dL (ref 8.9–10.3)
Chloride: 100 mmol/L (ref 98–111)
Creatinine, Ser: 1.12 mg/dL (ref 0.61–1.24)
GFR, Estimated: >60 mL/min (ref >60)
Glucose, Bld: 147 mg/dL — ABNORMAL HIGH (ref 70–99)
Potassium: 3.5 mmol/L (ref 3.5–5.1)
Sodium: 136 mmol/L (ref 135–145)

## 2023-12-09 LAB — I-STAT CG4 LACTIC ACID, ED
Lactic Acid, Venous: 1.8 mmol/L (ref 0.5–1.9)
Lactic Acid, Venous: 1.8 mmol/L (ref 0.5–1.9)

## 2023-12-09 MED ORDER — OXYCODONE-ACETAMINOPHEN 5-325 MG PO TABS: 1.0000 | ORAL_TABLET | Freq: Four times a day (QID) | ORAL | 0 refills | Status: DC | PRN

## 2023-12-09 MED ORDER — AMOXICILLIN-POT CLAVULANATE 875-125 MG PO TABS: 1.0000 | ORAL_TABLET | Freq: Two times a day (BID) | ORAL | 0 refills | Status: DC

## 2023-12-09 MED ORDER — MORPHINE SULFATE (PF) 2 MG/ML IV SOLN
2.0000 mg | Freq: Once | INTRAVENOUS | Status: AC
  Administered 2023-12-09: 2 mg via INTRAVENOUS
  Filled 2023-12-09: qty 1

## 2023-12-09 MED ORDER — DEXAMETHASONE SODIUM PHOSPHATE 10 MG/ML IJ SOLN
10.0000 mg | Freq: Once | INTRAMUSCULAR | Status: AC
  Administered 2023-12-09: 10 mg via INTRAVENOUS
  Filled 2023-12-09: qty 1

## 2023-12-09 MED ORDER — IOHEXOL 350 MG/ML SOLN
75.0000 mL | Freq: Once | INTRAVENOUS | Status: AC | PRN
  Administered 2023-12-09: 75 mL via INTRAVENOUS

## 2023-12-09 MED ORDER — OXYCODONE-ACETAMINOPHEN 5-325 MG PO TABS: 1.0000 | ORAL_TABLET | Freq: Once | ORAL | Status: DC

## 2023-12-09 MED ORDER — SODIUM CHLORIDE 0.9 % IV BOLUS
1000.0000 mL | Freq: Once | INTRAVENOUS | Status: AC
  Administered 2023-12-09: 1000 mL via INTRAVENOUS

## 2023-12-09 MED ORDER — CLINDAMYCIN PHOSPHATE 600 MG/50ML IV SOLN
600.0000 mg | Freq: Once | INTRAVENOUS | Status: AC
  Administered 2023-12-09: 600 mg via INTRAVENOUS
  Filled 2023-12-09: qty 50

## 2023-12-09 NOTE — ED Provider Notes (Signed)
  Physical Exam  BP 127/89   Pulse 87   Temp 98.9 F (37.2 C)   Resp 13   Ht 5' 11 (1.803 m)   Wt 114.3 kg   SpO2 97%   BMI 35.15 kg/m   Physical Exam  Procedures  Procedures  ED Course / MDM   Clinical Course as of 12/09/23 1703  Fri Dec 09, 2023  1415 Discussed with Dr. MAGGIE (ENT) who agrees with plan to initiate antibiotics.  Depending on patient's level of comfort and stability, would be appropriate for outpatient follow-up in the office early next week versus admission for observation.  Discussed this with the patient who at this time does want to go home on antibiotics and follow-up with ENT on Monday.   [MP]  1540 I, Ozell Marine DO, am transitioning care of this patient to the oncoming provider pending CT maxillofacial and CT soft tissue neck results and disposition [MP]    Clinical Course User Index [MP] Marine Ozell LABOR, DO   Medical Decision Making Amount and/or Complexity of Data Reviewed Radiology: ordered.  Risk Prescription drug management.   Received in signout.  Facial swelling.  Recent parotitis but not on antibiotics.  CT scans pending.  Discharge home versus admit.  CT scan shows proctitis.  Does have stone.  Patient eager to go home.  Has plan for follow-up with ENT.  Will discharge home with return precautions given.       Patsey Lot, MD 12/09/23 [phone]

## 2023-12-09 NOTE — Telephone Encounter (Signed)
Patient in the ED  °

## 2023-12-09 NOTE — Discharge Instructions (Addendum)
 You were seen in the emerged department for facial swelling We gave you dose of steroids and a dose of antibiotics here The CAT scan showed increased swelling We spoke with Dr.vandegriend about this who is an ENT specialist It is important that you call his office today and schedule an appointment for a follow-up visit on Monday to ensure this is getting better Pick up the prescribed antibiotics from your [facility] pharmacy and begin taking as directed twice a day for the next 7 days You can also take the tramadol  you were given previously to help with pain Return to the emergency department for severe pain, trouble swallowing, trouble breathing or any other concerns

## 2023-12-09 NOTE — ED Notes (Signed)
..  The patient is A&OX4, ambulatory at d/c with independent steady gait, NAD. Pt verbalized understanding of d/c instructions, prescriptions and follow up care.

## 2023-12-09 NOTE — ED Notes (Signed)
 Patient observed staring into space and arched back in his chair in the lobby. Patient did not respond to his name or to sternal rub. After about 15 seconds patient sat up in the chair and was immediately alert and oriented. Patient reported no aura, only felt very diaphoretic prior to episode. Patient reports no hx of seizures, only recent change to medications is tramadol  added on Wednesday for noted jaw swelling which he reports has gotten worse since then. Patient remains diaphoretic and may have been incontinent.

## 2023-12-09 NOTE — Telephone Encounter (Signed)
 FYI Only or Action Required?: FYI only for provider.  Patient was last seen in primary care on 10/19/2023 by Newlin, Enobong, MD. Called Nurse Triage reporting No chief complaint on file.. Symptoms began today. Interventions attempted: Other: Recent ED Visit. Symptoms are: unchanged.  Triage Disposition: Go to ED or PCP/Alternative with Approval  Patient/caregiver understands and will follow disposition?: Yes   Copied from CRM [id number]. Topic: Clinical - Red Word Triage >> Dec 09, 2023 10:31 AM Joesph PARAS wrote: Red Word that prompted transfer to Nurse Triage: Face is swollen and very painful, throat is starting to swell, and is in a lot of pain. Hot compresses, lemon, water, etc not helping at this time, was treated but is not getting any better. Wife on line with patient, as patient is struggling to speak.  Reason for Disposition  Patient sounds very sick or weak to the triager    Acute neck swelling present with difficulty swallowing, voice changes (hoarseness), and visible distortion.  Answer Assessment - Initial Assessment Questions 1. APPEARANCE of FACE: What does it look like?     Visitble distortion  2. LOCATION: What part of the face is swollen?     Left Side, Jawbone to the back of the ear  3. SEVERITY: How swollen is it?     Very noticeable  4. ONSET: When did the face swelling start?     Several days ago  5. ITCHING: Is there any itching? If so, ask: How much?     No  6. CAUSE: What do you think is causing the face swelling?     Parotitis  7. MEDICATION: Is your child taking any prescription medications?     No  8. RECURRENT SYMPTOM: Has your child had face swelling before? If so, ask: When was the last time? What happened that time?     No  9. OTHER SYMPTOMS: Does your child have any other symptoms? (e.g., difficulty breathing or swallowing)     Voice Changes (Hoarseness), Difficulty eating/Swallowing  Protocols used: Face Swelling-P-AH,  Face Swelling-A-AH

## 2023-12-09 NOTE — ED Provider Notes (Signed)
 [place] EMERGENCY DEPARTMENT AT [facility] Provider Note   CSN: [id number] Arrival date & time: 12/09/23  1157     Patient presents with: Facial Swelling, siezure like actitivty, and diaphoresis   Thomas Cummings is a 58 y.o. male.  With history of type 2 diabetes and hypertension who presents to ED for facial swelling.  Patient was seen in the emergency department 2 days ago for left-sided facial swelling diagnosed with parotitis.  He was discharged with instruction for warm compresses and tramadol  for pain management.  He returns today given interval worsening of his pain and swelling.  Some difficulty with swallowing but able to eat and drink.  No shortness of breath fevers chills or other complaints at this time.  No prior history of similar presentations or significant odontogenic infections   HPI     Prior to Admission medications   Medication Sig Start Date End Date Taking? Authorizing Provider  amLODipine  (NORVASC ) 10 MG tablet Take 1 tablet (10 mg total) by mouth daily. 10/19/23 02/16/24 Yes Newlin, Enobong, MD  amoxicillin-clavulanate (AUGMENTIN) 875-125 MG tablet Take 1 tablet by mouth every 12 (twelve) hours. 12/09/23  Yes Pamella Ozell LABOR, DO  atorvastatin  (LIPITOR) 40 MG tablet Take 1 tablet (40 mg total) by mouth daily. 10/19/23 04/16/24 Yes Newlin, Enobong, MD  carvedilol  (COREG ) 25 MG tablet Take 1 tablet (25 mg total) by mouth 2 (two) times daily. 10/19/23 02/16/24 Yes Newlin, Enobong, MD  lisinopril  (ZESTRIL ) 40 MG tablet Take 1 tablet (40 mg total) by mouth daily. 10/19/23 02/16/24 Yes Newlin, Enobong, MD  metFORMIN  (GLUCOPHAGE ) 1000 MG tablet Take 1 tablet (1,000 mg total) by mouth 2 (two) times daily with a meal. 10/19/23  Yes Newlin, Enobong, MD  Semaglutide ,0.25 or 0.5MG /DOS, (OZEMPIC , 0.25 OR 0.5 MG/DOSE,) 2 MG/3ML SOPN Inject 0.5 mg into the skin once a week. 10/24/23  Yes Newlin, Enobong, MD  traMADol  (ULTRAM ) 50 MG tablet Take 1 tablet (50 mg total) by mouth every 6  (six) hours as needed. Patient taking differently: Take 50 mg by mouth every 6 (six) hours as needed for moderate pain (pain score 4-6) or severe pain (pain score 7-10). 12/08/23  Yes Jarold Olam HERO, PA-C  Misc. Devices MISC CPAP machine on AutoPap 5 to 15 cm water.  Diagnosis obstructive sleep apnea 10/11/22   Delbert Clam, MD  terbinafine  (LAMISIL ) 250 MG tablet Take 1 tablet (250 mg total) by mouth daily. Patient not taking: Reported on 12/09/2023 10/19/23   Newlin, Enobong, MD    Allergies: Patient has no known allergies.    Review of Systems  Updated Vital Signs BP 127/89   Pulse 87   Temp 98.9 F (37.2 C)   Resp 13   Ht 5' 11 (1.803 m)   Wt 114.3 kg   SpO2 97%   BMI 35.15 kg/m   Physical Exam Vitals and nursing note reviewed.  HENT:     Head: Normocephalic and atraumatic.     Mouth/Throat:     Comments: Significant swelling over left parotid as detailed below with associated tenderness No erythema or increased warmth No cervical lymphadenopathy Uvula midline No pharyngeal erythema exudate or abscess Speaking full sentences Tolerating secretions  Eyes:     Pupils: Pupils are equal, round, and reactive to light.    Cardiovascular:     Rate and Rhythm: Normal rate and regular rhythm.  Pulmonary:     Effort: Pulmonary effort is normal.     Breath sounds: Normal breath sounds.  Abdominal:  Palpations: Abdomen is soft.     Tenderness: There is no abdominal tenderness.   Skin:    General: Skin is warm and dry.   Neurological:     Mental Status: He is alert.   Psychiatric:        Mood and Affect: Mood normal.     (all labs ordered are listed, but only abnormal results are displayed) Labs Reviewed  BASIC METABOLIC PANEL WITH GFR - Abnormal; Notable for the following components:      Result Value   Glucose, Bld 147 (*)    All other components within normal limits  CBC WITH DIFFERENTIAL/PLATELET - Abnormal; Notable for the following components:   WBC  13.0 (*)    Neutro Abs 9.3 (*)    Monocytes Absolute 1.3 (*)    All other components within normal limits  CULTURE, BLOOD (ROUTINE X 2)  CULTURE, BLOOD (ROUTINE X 2)  I-STAT CG4 LACTIC ACID, ED  I-STAT CG4 LACTIC ACID, ED    EKG: None  Radiology: CT Maxillofacial W Contrast Result Date: 12/09/2023 CLINICAL DATA:  L parotitis EXAM: CT MAXILLOFACIAL WITH CONTRAST TECHNIQUE: Multidetector CT imaging of the maxillofacial structures was performed with intravenous contrast. Multiplanar CT image reconstructions were also generated. RADIATION DOSE REDUCTION: This exam was performed according to the departmental dose-optimization program which includes automated exposure control, adjustment of the mA and/or kV according to patient size and/or use of iterative reconstruction technique. CONTRAST:  75mL OMNIPAQUE IOHEXOL 350 MG/ML SOLN COMPARISON:  Maxillofacial CT dated December 07, 2023. FINDINGS: Osseous: Intact and grossly unremarkable. No evidence of osteomyelitis. The patient is edentulous. There are 2 metallic implants present mandible anteriorly. Orbits: Normal. Sinuses: Minimal mucoperiosteal thickening within the frontal, ethmoid, maxillary and sphenoid sinuses. The ostiomeatal complex are patent. The frontoethmoidal recesses are clear. Soft tissues: The left parotid gland remains mildly enlarged and heterogeneous in attenuation. There is a 2 mm calculus again demonstrated within the distal left parotid duct. The left masseter muscle remains mildly edematous. There are mildly prominent left level 2 cervical lymph nodes. Limited intracranial: Unremarkable. IMPRESSION: 1. Left parotiditis secondary to obstructing 2 mm calculus in the distal parotid duct. Electronically Signed   By: Evalene Coho M.D.   On: 12/09/2023 16:08   CT Soft Tissue Neck W Contrast Result Date: 12/09/2023 CLINICAL DATA:  L parotitis EXAM: CT NECK WITH CONTRAST TECHNIQUE: Multidetector CT imaging of the neck was performed using  the standard protocol following the bolus administration of intravenous contrast. RADIATION DOSE REDUCTION: This exam was performed according to the departmental dose-optimization program which includes automated exposure control, adjustment of the mA and/or kV according to patient size and/or use of iterative reconstruction technique. CONTRAST:  75mL OMNIPAQUE IOHEXOL 350 MG/ML SOLN COMPARISON:  None Available. FINDINGS: Pharynx and larynx: No masses or inflammatory changes. Salivary glands: The left parotid gland is swollen and heterogeneous in attenuation. There is adjacent soft tissue stranding, involving the surrounding fat and left masseter muscle. There is a 2 mm round calculus along the left maxillary alveolar ridge, which may be within the distal parotid duct. Thyroid : 6 mm nodule/cyst within the superior pole. Otherwise normal in size and morphology. Lymph nodes: Scattered shotty cervical lymph nodes present bilaterally. No pathologically enlarged or morphologically suspicious lymph nodes. Vascular: Minimal calcific atheromatous disease. Limited intracranial: Negative. Visualized orbits: Unremarkable. Mastoids and visualized paranasal sinuses: Mild mucosal disease within the floor of the right maxillary sinus. The paranasal sinuses otherwise appear clear to the extent demonstrated. Skeleton: Unremarkable. Upper  chest: Negative. Other: None. IMPRESSION: 1. Left parotiditis, likely secondary to suspected 2 mm obstructing calculus in the distal parotid duct. Electronically Signed   By: Evalene Coho M.D.   On: 12/09/2023 15:54   CT Maxillofacial Wo Contrast Result Date: 12/07/2023 CLINICAL DATA:  Left neck swelling. EXAM: CT MAXILLOFACIAL WITHOUT CONTRAST TECHNIQUE: Multidetector CT imaging of the maxillofacial structures was performed. Multiplanar CT image reconstructions were also generated. RADIATION DOSE REDUCTION: This exam was performed according to the departmental dose-optimization program  which includes automated exposure control, adjustment of the mA and/or kV according to patient size and/or use of iterative reconstruction technique. COMPARISON:  None Available. FINDINGS: Osseous: No fracture or mandibular dislocation. No destructive process. Orbits: Negative. No traumatic or inflammatory finding. Sinuses: Clear Soft tissues: There is enlargement of the left parotid gland with surrounding inflammation compatible with parotitis. Remainder of the soft tissues unremarkable. Limited intracranial: No significant or unexpected finding. IMPRESSION: Enlargement of the left parotid gland with surrounding inflammation compatible with parotitis. Electronically Signed   By: Franky Crease M.D.   On: 12/07/2023 22:31     Procedures   Medications Ordered in the ED  oxyCODONE -acetaminophen  (PERCOCET/ROXICET) 5-325 MG per tablet 1 tablet (1 tablet Oral Not Given 12/09/23 1401)  dexamethasone (DECADRON) injection 10 mg (10 mg Intravenous Given 12/09/23 1403)  clindamycin (CLEOCIN) IVPB 600 mg (0 mg Intravenous Stopped 12/09/23 1436)  sodium chloride 0.9 % bolus 1,000 mL (0 mLs Intravenous Stopped 12/09/23 1515)  morphine (PF) 2 MG/ML injection 2 mg (2 mg Intravenous Given 12/09/23 1402)  iohexol (OMNIPAQUE) 350 MG/ML injection 75 mL (75 mLs Intravenous Contrast Given 12/09/23 1524)    Clinical Course as of 12/09/23 1625  Fri Dec 09, 2023  1415 Discussed with Dr. MAGGIE (ENT) who agrees with plan to initiate antibiotics.  Depending on patient's level of comfort and stability, would be appropriate for outpatient follow-up in the office early next week versus admission for observation.  Discussed this with the patient who at this time does want to go home on antibiotics and follow-up with ENT on Monday.   [MP]  1540 I, Ozell Marine DO, am transitioning care of this patient to the oncoming provider pending CT maxillofacial and CT soft tissue neck results and disposition [MP]    Clinical Course User  Index [MP] Marine Ozell LABOR, DO                                 Medical Decision Making 58 year old male returns for interval worsening of parotitis which he was seen for here 2 days ago.  No fevers chills.  Speaking full sentences tolerating secretions able to eat and drink although some discomfort with swallowing.  No evidence of airway compromise at this time.  Will provide him with a dose of antibiotics (clindamycin), Decadron and repeat CT imaging.  Will repeat laboratory workup to see if he now has a leukocytosis.  Will reach out to ENT  Amount and/or Complexity of Data Reviewed Radiology: ordered.  Risk Prescription drug management.        Final diagnoses:  Parotitis    ED Discharge Orders          Ordered    amoxicillin-clavulanate (AUGMENTIN) 875-125 MG tablet  Every 12 hours        12/09/23 1555               Marine Ozell LABOR, DO 12/09/23 1625

## 2023-12-09 NOTE — ED Triage Notes (Addendum)
 Pt here for a swollen left side of jaw. Pt states throat and ear swelling. Pt here Wednesday for same issue. Pt talking in triage. No oral swelling and denies shob

## 2023-12-09 NOTE — ED Provider Triage Note (Signed)
 Emergency Medicine Provider Triage Evaluation Note  Belal Scallon , a 58 y.o. male  was evaluated in triage.  Pt complains of facial swelling. Pain and swelling to L side of face x 2 days.  Seen yesterday and had CT showing parotitis.  D/c home with tramadol .  Now having worsening pain and swelling and now endorse neck pain  Review of Systems  Positive: As above Negative: As above  Physical Exam  BP (!) 145/98 (BP Location: Right Arm)   Pulse (!) 111   Temp 98.9 F (37.2 C)   Resp 20   Ht 5' 11 (1.803 m)   Wt 114.3 kg   SpO2 98%   BMI 35.15 kg/m  Gen:   Awake, no distress   Resp:  Normal effort  MSK:   Moves extremities without difficulty  Other:  Edema to L parotid region  Medical Decision Making  Medically screening exam initiated at 12:40 PM.  Appropriate orders placed.  Chrsitopher Wik was informed that the remainder of the evaluation will be completed by another provider, this initial triage assessment does not replace that evaluation, and the importance of remaining in the ED until their evaluation is complete.     Nivia Colon, PA-C 12/09/23 1241

## 2023-12-13 DIAGNOSIS — K1121 Acute sialoadenitis: Secondary | ICD-10-CM | POA: Diagnosis not present

## 2023-12-13 DIAGNOSIS — K115 Sialolithiasis: Secondary | ICD-10-CM | POA: Diagnosis not present

## 2023-12-13 DIAGNOSIS — G4733 Obstructive sleep apnea (adult) (pediatric): Secondary | ICD-10-CM | POA: Diagnosis not present

## 2023-12-14 LAB — CULTURE, BLOOD (ROUTINE X 2)
Culture: NO GROWTH
Culture: NO GROWTH
Special Requests: ADEQUATE
Special Requests: ADEQUATE

## 2023-12-20 DIAGNOSIS — K1121 Acute sialoadenitis: Secondary | ICD-10-CM | POA: Diagnosis not present

## 2023-12-20 DIAGNOSIS — K115 Sialolithiasis: Secondary | ICD-10-CM | POA: Diagnosis not present

## 2023-12-29 ENCOUNTER — Encounter: Payer: Self-pay | Admitting: Pediatrics

## 2023-12-29 ENCOUNTER — Ambulatory Visit (AMBULATORY_SURGERY_CENTER)

## 2023-12-29 VITALS — Ht 71.0 in | Wt 262.0 lb

## 2023-12-29 DIAGNOSIS — R195 Other fecal abnormalities: Secondary | ICD-10-CM

## 2023-12-29 MED ORDER — NA SULFATE-K SULFATE-MG SULF 17.5-3.13-1.6 GM/177ML PO SOLN: 1.0000 | Freq: Once | ORAL | 0 refills | Status: AC

## 2023-12-29 NOTE — Progress Notes (Signed)
 No egg or soy allergy known to patient  No issues known to pt with past sedation with any surgeries or procedures Patient denies ever being told they had issues or difficulty with intubation  No FH of Malignant Hyperthermia Pt is not on diet pills, On Ozempic  and hold instructions provided Pt is not on  home 02  Pt is not on blood thinners  Pt denies issues with constipation  No A fib or A flutter Have any cardiac testing pending--No Pt can ambulate  Pt denies use of chewing tobacco Discussed diabetic I weight loss medication holds Discussed NSAID holds Checked BMI Pt instructed to use Singlecare.com or GoodRx for a price reduction on prep  Patient's chart reviewed by Norleen Schillings CNRA prior to previsit and patient appropriate for the [facility].  Pre visit completed and red dot placed by patient's name on their procedure day (on provider's schedule).

## 2023-12-30 DIAGNOSIS — G4733 Obstructive sleep apnea (adult) (pediatric): Secondary | ICD-10-CM | POA: Diagnosis not present

## 2024-01-17 NOTE — Progress Notes (Unsigned)
 [facility] History and Physical   Primary Care Physician:  Delbert Clam, MD   Reason for Procedure:  Positive Cologuard  Plan:    Colonoscopy     HPI: Thomas Cummings is a 58 y.o. male undergoing colonoscopy for investigation of positive Cologuard 11/2023.  This is the patient's first colonoscopy.  No family history of colorectal cancer or polyps.  Patient denies current symptoms of change in bowel habits or rectal bleeding.   Past Medical History:  Diagnosis Date   BMI 40.0-44.9, adult (HCC)    Diabetes mellitus due to underlying condition with hyperosmolarity without coma, without long-term current use of insulin (HCC)    Essential hypertension    Hyperlipidemia    Phreesia 02/19/2020   Hypertension    Phreesia 02/19/2020   Mixed hyperlipidemia    OSA (obstructive sleep apnea)    Sleep apnea     Past Surgical History:  Procedure Laterality Date   SHOULDER SURGERY Right    2022   WRIST SURGERY Left    small knot removed    Prior to Admission medications   Medication Sig Start Date End Date Taking? Authorizing Provider  amLODipine  (NORVASC ) 10 MG tablet Take 1 tablet (10 mg total) by mouth daily. 10/19/23 02/16/24  Newlin, Enobong, MD  amoxicillin -clavulanate (AUGMENTIN ) 875-125 MG tablet Take 1 tablet by mouth every 12 (twelve) hours. Patient not taking: Reported on 12/29/2023 12/09/23   Pamella Ozell LABOR, DO  atorvastatin  (LIPITOR) 40 MG tablet Take 1 tablet (40 mg total) by mouth daily. 10/19/23 04/16/24  Newlin, Enobong, MD  carvedilol  (COREG ) 25 MG tablet Take 1 tablet (25 mg total) by mouth 2 (two) times daily. 10/19/23 02/16/24  Newlin, Enobong, MD  lisinopril  (ZESTRIL ) 40 MG tablet Take 1 tablet (40 mg total) by mouth daily. 10/19/23 02/16/24  Newlin, Enobong, MD  metFORMIN  (GLUCOPHAGE ) 1000 MG tablet Take 1 tablet (1,000 mg total) by mouth 2 (two) times daily with a meal. 10/19/23   Delbert Clam, MD  Misc. Devices MISC CPAP machine on AutoPap 5 to 15 cm water.   Diagnosis obstructive sleep apnea 10/11/22   Delbert Clam, MD  oxyCODONE -acetaminophen  (PERCOCET/ROXICET) 5-325 MG tablet Take 1-2 tablets by mouth every 6 (six) hours as needed for severe pain (pain score 7-10). Patient not taking: Reported on 12/29/2023 12/09/23   Patsey Lot, MD  Semaglutide ,0.25 or 0.5MG /DOS, (OZEMPIC , 0.25 OR 0.5 MG/DOSE,) 2 MG/3ML SOPN Inject 0.5 mg into the skin once a week. 10/24/23   Newlin, Enobong, MD  terbinafine  (LAMISIL ) 250 MG tablet Take 1 tablet (250 mg total) by mouth daily. Patient not taking: Reported on 12/29/2023 10/19/23   Newlin, Enobong, MD    Current Outpatient Medications  Medication Sig Dispense Refill   amLODipine  (NORVASC ) 10 MG tablet Take 1 tablet (10 mg total) by mouth daily. 90 tablet 1   atorvastatin  (LIPITOR) 40 MG tablet Take 1 tablet (40 mg total) by mouth daily. 90 tablet 1   carvedilol  (COREG ) 25 MG tablet Take 1 tablet (25 mg total) by mouth 2 (two) times daily. 180 tablet 1   lisinopril  (ZESTRIL ) 40 MG tablet Take 1 tablet (40 mg total) by mouth daily. 90 tablet 1   metFORMIN  (GLUCOPHAGE ) 1000 MG tablet Take 1 tablet (1,000 mg total) by mouth 2 (two) times daily with a meal. 180 tablet 1   Misc. Devices MISC CPAP machine on AutoPap 5 to 15 cm water.  Diagnosis obstructive sleep apnea 1 each 0   Semaglutide ,0.25 or 0.5MG /DOS, (OZEMPIC , 0.25 OR 0.5  MG/DOSE,) 2 MG/3ML SOPN Inject 0.5 mg into the skin once a week. 36 mL 1   Current Facility-Administered Medications  Medication Dose Route Frequency Provider Last Rate Last Admin   0.9 %  sodium chloride  infusion  500 mL Intravenous Once Arham Symmonds, Inocente HERO, MD        Allergies as of 01/19/2024   (No Known Allergies)    Family History  Problem Relation Age of Onset   Diabetes Mother    Alzheimer's disease Father    Colon cancer Neg Hx    Rectal cancer Neg Hx    Stomach cancer Neg Hx    Esophageal cancer Neg Hx     Social History   Socioeconomic History   Marital status:  Married    Spouse name: Not on file   Number of children: Not on file   Years of education: Not on file   Highest education level: Not on file  Occupational History   Not on file  Tobacco Use   Smoking status: Never    Passive exposure: Never   Smokeless tobacco: Never  Vaping Use   Vaping status: Never Used  Substance and Sexual Activity   Alcohol use: Not Currently   Drug use: Never   Sexual activity: Yes    Partners: Female  Other Topics Concern   Not on file  Social History Narrative   Not on file   Social Drivers of Health   Financial Resource Strain: Low Risk  (04/12/2023)   Overall Financial Resource Strain (CARDIA)    Difficulty of Paying Living Expenses: Not hard at all  Food Insecurity: No Food Insecurity (04/12/2023)   Hunger Vital Sign    Worried About Running Out of Food in the Last Year: Never true    Ran Out of Food in the Last Year: Never true  Transportation Needs: No Transportation Needs (04/12/2023)   PRAPARE - Administrator, Civil Service (Medical): No    Lack of Transportation (Non-Medical): No  Physical Activity: Inactive (04/12/2023)   Exercise Vital Sign    Days of Exercise per Week: 0 days    Minutes of Exercise per Session: 0 min  Stress: No Stress Concern Present (04/12/2023)   [employer] of Occupational Health - Occupational Stress Questionnaire    Feeling of Stress : Not at all  Social Connections: Moderately Integrated (04/12/2023)   Social Connection and Isolation Panel    Frequency of Communication with Friends and Family: More than three times a week    Frequency of Social Gatherings with Friends and Family: Once a week    Attends Religious Services: More than 4 times per year    Active Member of [employer] or Organizations: No    Attends Banker Meetings: Never    Marital Status: Married  Catering manager Violence: Not At Risk (04/12/2023)   Humiliation, Afraid, Rape, and Kick questionnaire    Fear of  Current or Ex-Partner: No    Emotionally Abused: No    Physically Abused: No    Sexually Abused: No    Review of Systems:  All other review of systems negative except as mentioned in the HPI.  Physical Exam: Vital signs BP (!) 122/90   Pulse 65   Temp (!) 97.5 F (36.4 C) (Temporal)   Resp 10   Ht 5' 11 (1.803 m)   Wt 262 lb (118.8 kg)   SpO2 (!) 63%   BMI 36.54 kg/m   General:   Alert,  Well-developed, well-nourished, pleasant and cooperative in NAD Airway:  Mallampati 1 Lungs:  Clear throughout to auscultation.   Heart:  Regular rate and rhythm; no murmurs, clicks, rubs,  or gallops. Abdomen:  Soft, nontender and nondistended. Normal bowel sounds.   Neuro/Psych:  Normal mood and affect. A and O x 3  Inocente Hausen, MD [facility]

## 2024-01-19 ENCOUNTER — Ambulatory Visit: Admitting: Pediatrics

## 2024-01-19 ENCOUNTER — Encounter: Payer: Self-pay | Admitting: Pediatrics

## 2024-01-19 VITALS — BP 126/87 | HR 69 | Temp 97.5°F | Resp 13 | Ht 71.0 in | Wt 262.0 lb

## 2024-01-19 DIAGNOSIS — D120 Benign neoplasm of cecum: Secondary | ICD-10-CM | POA: Diagnosis not present

## 2024-01-19 DIAGNOSIS — D124 Benign neoplasm of descending colon: Secondary | ICD-10-CM | POA: Diagnosis not present

## 2024-01-19 DIAGNOSIS — Z1211 Encounter for screening for malignant neoplasm of colon: Secondary | ICD-10-CM | POA: Diagnosis not present

## 2024-01-19 DIAGNOSIS — K635 Polyp of colon: Secondary | ICD-10-CM

## 2024-01-19 DIAGNOSIS — D122 Benign neoplasm of ascending colon: Secondary | ICD-10-CM

## 2024-01-19 DIAGNOSIS — R195 Other fecal abnormalities: Secondary | ICD-10-CM | POA: Diagnosis not present

## 2024-01-19 DIAGNOSIS — D123 Benign neoplasm of transverse colon: Secondary | ICD-10-CM | POA: Diagnosis not present

## 2024-01-19 MED ORDER — SODIUM CHLORIDE 0.9 % IV SOLN: 500.0000 mL | Freq: Once | INTRAVENOUS | Status: DC

## 2024-01-19 NOTE — Progress Notes (Signed)
 Pt's states no medical or surgical changes since previsit or office visit.

## 2024-01-19 NOTE — Progress Notes (Signed)
 A/o x 3, VSS, gd SR's, pleased with anesthesia, report to RN

## 2024-01-19 NOTE — Progress Notes (Signed)
 Called to room to assist during endoscopic procedure.  Patient ID and intended procedure confirmed with present staff. Received instructions for my participation in the procedure from the performing physician.

## 2024-01-19 NOTE — Op Note (Addendum)
 [facility] Patient Name: Thomas Cummings Procedure Date: 01/19/2024 11:39 AM MRN: [id number] Endoscopist: Inocente Hausen , MD, [id number] Age: 58 Referring MD:  Date of Birth: 1965-07-26 Gender: Male Account #: [id number] Procedure:                Colonoscopy Indications:              This is the patient's first colonoscopy, Positive                            Cologuard test Medicines:                Monitored Anesthesia Care Procedure:                Pre-Anesthesia Assessment:                           - Prior to the procedure, a History and Physical                            was performed, and patient medications and                            allergies were reviewed. The patient's tolerance of                            previous anesthesia was also reviewed. The risks                            and benefits of the procedure and the sedation                            options and risks were discussed with the patient.                            All questions were answered, and informed consent                            was obtained. Prior Anticoagulants: The patient has                            taken no anticoagulant or antiplatelet agents. ASA                            Grade Assessment: II - A patient with mild systemic                            disease. After reviewing the risks and benefits,                            the patient was deemed in satisfactory condition to                            undergo the procedure.  After obtaining informed consent, the colonoscope                            was passed under direct vision. Throughout the                            procedure, the patient's blood pressure, pulse, and                            oxygen saturations were monitored continuously. The                            Olympus Scope SN: [id number] was introduced through                            the anus and advanced to the cecum,  identified by                            appendiceal orifice and ileocecal valve. The                            colonoscopy was performed without difficulty. The                            patient tolerated the procedure well. The quality                            of the bowel preparation was fair. The ileocecal                            valve, appendiceal orifice, and rectum were                            photographed. Scope In: 11:54:23 AM Scope Out: 12:20:24 PM Scope Withdrawal Time: 0 hours 21 minutes 50 seconds  Total Procedure Duration: 0 hours 26 minutes 1 second  Findings:                 The perianal and digital rectal examinations were                            normal. Pertinent negatives include normal                            sphincter tone and no palpable rectal lesions.                           A large amount of semi-liquid stool was found in                            the entire colon. Lavage of the area was performed                            using a large amount of sterile water, resulting in  clearance with fair visualization.                           Eight sessile polyps were found in the descending                            colon, transverse colon, ascending colon and cecum.                            The polyps were 4 to 7 mm in size. These polyps                            were removed with a cold snare. Resection and                            retrieval were complete.                           Two sessile polyps were found in the descending                            colon and ascending colon. The polyps were 10 to 14                            mm in size. These polyps were removed with a hot                            snare. Resection and retrieval were complete.                           The retroflexed view of the distal rectum and anal                            verge was normal and showed no anal or rectal                             abnormalities. Complications:            No immediate complications. Estimated blood loss:                            Minimal. Estimated Blood Loss:     Estimated blood loss was minimal. Impression:               - Preparation of the colon was fair.                           - Stool in the entire examined colon.                           - Eight 4 to 7 mm polyps in the descending colon,                            in the transverse colon, in the ascending  colon and                            in the cecum, removed with a cold snare. Resected                            and retrieved.                           - Two 10 to 14 mm polyps in the descending colon                            and in the ascending colon, removed with a hot                            snare. Resected and retrieved.                           - The distal rectum and anal verge are normal on                            retroflexion view. Recommendation:           - Discharge patient to home (ambulatory).                           - Await pathology results.                           - Repeat colonoscopy in 1 year for surveillance.                           - The findings and recommendations were discussed                            with the patient's family.                           - Return to referring physician.                           - Patient has a contact number available for                            emergencies. The signs and symptoms of potential                            delayed complications were discussed with the                            patient. Return to normal activities tomorrow.                            Written discharge instructions were provided to the  patient. Inocente Hausen, MD 01/19/2024 12:27:05 PM This report has been signed electronically. Addendum Number: 1   Addendum Date: 01/19/2024 6:56:38 PM      Due to fair bowel preparation during today's  colonoscopy, patient will       require a 2-day bowel prep prior to next colonoscopy. Inocente Hausen, MD 01/19/2024 6:57:13 PM This report has been signed electronically.

## 2024-01-19 NOTE — Patient Instructions (Signed)
 Handouts on polyps given to patient Await pathology results  Resume previous diet and continue present medications Repeat colonoscopy in 1 year for screening purposes    YOU HAD AN ENDOSCOPIC PROCEDURE TODAY AT THE [facility]:   Refer to the procedure report that was given to you for any specific questions about what was found during the examination.  If the procedure report does not answer your questions, please call your gastroenterologist to clarify.  If you requested that your care partner not be given the details of your procedure findings, then the procedure report has been included in a sealed envelope for you to review at your convenience later.  YOU SHOULD EXPECT: Some feelings of bloating in the abdomen. Passage of more gas than usual.  Walking can help get rid of the air that was put into your GI tract during the procedure and reduce the bloating. If you had a lower endoscopy (such as a colonoscopy or flexible sigmoidoscopy) you may notice spotting of blood in your stool or on the toilet paper. If you underwent a bowel prep for your procedure, you may not have a normal bowel movement for a few days.  Please Note:  You might notice some irritation and congestion in your nose or some drainage.  This is from the oxygen used during your procedure.  There is no need for concern and it should clear up in a day or so.  SYMPTOMS TO REPORT IMMEDIATELY:  Following lower endoscopy (colonoscopy or flexible sigmoidoscopy):  Excessive amounts of blood in the stool  Significant tenderness or worsening of abdominal pains  Swelling of the abdomen that is new, acute  Fever of 100F or higher  For urgent or emergent issues, a gastroenterologist can be reached at any hour by calling [phone]. Do not use MyChart messaging for urgent concerns.    DIET:  We do recommend a small meal at first, but then you may proceed to your regular diet.  Drink plenty of fluids but you should avoid  alcoholic beverages for 24 hours.  ACTIVITY:  You should plan to take it easy for the rest of today and you should NOT DRIVE or use heavy machinery until tomorrow (because of the sedation medicines used during the test).    FOLLOW UP: Our staff will call the number listed on your records the next business day following your procedure.  We will call around 7:15- 8:00 am to check on you and address any questions or concerns that you may have regarding the information given to you following your procedure. If we do not reach you, we will leave a message.     If any biopsies were taken you will be contacted by phone or by letter within the next 1-3 weeks.  Please call us  at [phone] if you have not heard about the biopsies in 3 weeks.    SIGNATURES/CONFIDENTIALITY: You and/or your care partner have signed paperwork which will be entered into your electronic medical record.  These signatures attest to the fact that that the information above on your After Visit Summary has been reviewed and is understood.  Full responsibility of the confidentiality of this discharge information lies with you and/or your care-partner.

## 2024-01-20 ENCOUNTER — Telehealth: Payer: Self-pay

## 2024-01-20 NOTE — Telephone Encounter (Signed)
  Follow up Call-     01/19/2024   10:20 AM  Call back number  Post procedure Call Back phone  # [phone]  Permission to leave phone message Yes     Patient questions:  Do you have a fever, pain , or abdominal swelling? No. Pain Score  0 *  Have you tolerated food without any problems? Yes.    Have you been able to return to your normal activities? Yes.    Do you have any questions about your discharge instructions: Diet   No. Medications  No. Follow up visit  No.  Do you have questions or concerns about your Care? No.  Actions: * If pain score is 4 or above: No action needed, pain <4.

## 2024-01-23 ENCOUNTER — Ambulatory Visit: Payer: Self-pay | Admitting: Pediatrics

## 2024-01-23 LAB — SURGICAL PATHOLOGY

## 2024-03-12 NOTE — Progress Notes (Signed)
 Thomas Cummings                                          MRN: [id number]   03/12/2024   The [facility] Quality Team Specialist reviewed this patient medical record for the purposes of chart review for care gap closure. The following were reviewed: chart review for care gap closure-kidney health evaluation for diabetes:eGFR  and uACR.    [facility] Quality Team

## 2024-04-30 ENCOUNTER — Encounter: Payer: Self-pay | Admitting: Nurse Practitioner

## 2024-04-30 ENCOUNTER — Ambulatory Visit: Attending: Nurse Practitioner | Admitting: Nurse Practitioner

## 2024-04-30 VITALS — BP 152/94 | HR 82 | Resp 19 | Ht 71.0 in | Wt 270.8 lb

## 2024-04-30 DIAGNOSIS — Z7985 Long-term (current) use of injectable non-insulin antidiabetic drugs: Secondary | ICD-10-CM | POA: Diagnosis not present

## 2024-04-30 DIAGNOSIS — E119 Type 2 diabetes mellitus without complications: Secondary | ICD-10-CM | POA: Diagnosis not present

## 2024-04-30 DIAGNOSIS — I10 Essential (primary) hypertension: Secondary | ICD-10-CM | POA: Diagnosis not present

## 2024-04-30 MED ORDER — AMLODIPINE BESYLATE 10 MG PO TABS: 10.0000 mg | ORAL_TABLET | Freq: Every day | ORAL | 1 refills | Status: AC

## 2024-04-30 MED ORDER — LISINOPRIL 40 MG PO TABS: 40.0000 mg | ORAL_TABLET | Freq: Every day | ORAL | 1 refills | Status: AC

## 2024-04-30 MED ORDER — METFORMIN HCL 1000 MG PO TABS: 1000.0000 mg | ORAL_TABLET | Freq: Two times a day (BID) | ORAL | 1 refills | Status: AC

## 2024-04-30 MED ORDER — CARVEDILOL 25 MG PO TABS: 25.0000 mg | ORAL_TABLET | Freq: Two times a day (BID) | ORAL | 1 refills | Status: AC

## 2024-04-30 NOTE — Progress Notes (Unsigned)
 Assessment & Plan:  Thomas Cummings was seen today for hypertension.  Diagnoses and all orders for this visit:  Primary hypertension -     CMP14+EGFR -     lisinopril  (ZESTRIL ) 40 MG tablet; Take 1 tablet (40 mg total) by mouth daily. -     carvedilol  (COREG ) 25 MG tablet; Take 1 tablet (25 mg total) by mouth 2 (two) times daily. -     amLODipine  (NORVASC ) 10 MG tablet; Take 1 tablet (10 mg total) by mouth daily. Continue all antihypertensives as prescribed.  Reminded to bring in blood pressure log for follow  up appointment.  RECOMMENDATIONS: DASH/Mediterranean Diets are healthier choices for HTN.    Diabetes mellitus treated with injections of non-insulin medication  Increase ozempic  to 1 mg weekly -     Hemoglobin A1c -     metFORMIN  (GLUCOPHAGE ) 1000 MG tablet; Take 1 tablet (1,000 mg total) by mouth 2 (two) times daily with a meal. Increase ozempic  to 1mg  weekly Continue all other medications as prescribed    Patient has been counseled on age-appropriate routine health concerns for screening and prevention. These are reviewed and up-to-date. Referrals have been placed accordingly. Immunizations are up-to-date or declined.    Subjective:   Chief Complaint  Patient presents with   Hypertension    Thomas Cummings 58 y.o. male presents to office today for follow up to HTN  He has a past medical history of BMI 40.0-44.9, adult, Diabetes mellitus due to underlying condition with hyperosmolarity without coma, without long-term current use of insulin (HCC), Essential hypertension, Hyperlipidemia, Hypertension, Mixed hyperlipidemia, OSA    HTN Blood pressure is elevated today. He is taking amlodipine , carvedilol  and lisinopril  as prescribed.  BP Readings from Last 3 Encounters:  04/30/24 (!) 152/94  01/19/24 126/87  12/09/23 126/88     DM2 A1c continues at 7 today. Will increase ozempic  to 0.5 mg weekly.  Lab Results  Component Value Date   HGBA1C 7.0 (H) 04/30/2024    Review  of Systems  Constitutional:  Negative for fever, malaise/fatigue and weight loss.  HENT: Negative.  Negative for nosebleeds.   Eyes: Negative.  Negative for blurred vision, double vision and photophobia.  Respiratory: Negative.  Negative for cough and shortness of breath.   Cardiovascular: Negative.  Negative for chest pain, palpitations and leg swelling.  Gastrointestinal: Negative.  Negative for heartburn, nausea and vomiting.  Musculoskeletal: Negative.  Negative for myalgias.  Neurological: Negative.  Negative for dizziness, focal weakness, seizures and headaches.  Psychiatric/Behavioral: Negative.  Negative for suicidal ideas.     Past Medical History:  Diagnosis Date   BMI 40.0-44.9, adult (HCC)    Diabetes mellitus due to underlying condition with hyperosmolarity without coma, without long-term current use of insulin (HCC)    Essential hypertension    Hyperlipidemia    Phreesia 02/19/2020   Hypertension    Phreesia 02/19/2020   Mixed hyperlipidemia    OSA (obstructive sleep apnea)    Sleep apnea     Past Surgical History:  Procedure Laterality Date   SHOULDER SURGERY Right    2022   WRIST SURGERY Left    small knot removed    Family History  Problem Relation Age of Onset   Diabetes Mother    Alzheimer's disease Father    Colon cancer Neg Hx    Rectal cancer Neg Hx    Stomach cancer Neg Hx    Esophageal cancer Neg Hx     Social History Reviewed with no  changes to be made today.   Outpatient Medications Prior to Visit  Medication Sig Dispense Refill   atorvastatin  (LIPITOR) 40 MG tablet Take 1 tablet (40 mg total) by mouth daily. 90 tablet 1   Misc. Devices MISC CPAP machine on AutoPap 5 to 15 cm water.  Diagnosis obstructive sleep apnea 1 each 0   Semaglutide ,0.25 or 0.5MG /DOS, (OZEMPIC , 0.25 OR 0.5 MG/DOSE,) 2 MG/3ML SOPN Inject 0.5 mg into the skin once a week. 36 mL 1   amLODipine  (NORVASC ) 10 MG tablet Take 1 tablet (10 mg total) by mouth daily. 90 tablet  1   carvedilol  (COREG ) 25 MG tablet Take 1 tablet (25 mg total) by mouth 2 (two) times daily. 180 tablet 1   lisinopril  (ZESTRIL ) 40 MG tablet Take 1 tablet (40 mg total) by mouth daily. 90 tablet 1   metFORMIN  (GLUCOPHAGE ) 1000 MG tablet Take 1 tablet (1,000 mg total) by mouth 2 (two) times daily with a meal. 180 tablet 1   No facility-administered medications prior to visit.    No Known Allergies     Objective:    BP (!) 152/94 (BP Location: Left Arm, Patient Position: Sitting, Cuff Size: Normal)   Pulse 82   Resp 19   Ht 5' 11 (1.803 m)   Wt 270 lb 12.8 oz (122.8 kg) Comment: Patient has on steel work boots.  SpO2 100%   BMI 37.77 kg/m  Wt Readings from Last 3 Encounters:  04/30/24 270 lb 12.8 oz (122.8 kg)  01/19/24 262 lb (118.8 kg)  12/29/23 262 lb (118.8 kg)    Physical Exam Vitals and nursing note reviewed.  Constitutional:      Appearance: He is well-developed.  HENT:     Head: Normocephalic and atraumatic.  Cardiovascular:     Rate and Rhythm: Normal rate and regular rhythm.     Heart sounds: Normal heart sounds. No murmur heard.    No friction rub. No gallop.  Pulmonary:     Effort: Pulmonary effort is normal. No tachypnea or respiratory distress.     Breath sounds: Normal breath sounds. No decreased breath sounds, wheezing, rhonchi or rales.  Chest:     Chest wall: No tenderness.  Musculoskeletal:        General: Normal range of motion.     Cervical back: Normal range of motion.  Skin:    General: Skin is warm and dry.  Neurological:     Mental Status: He is alert and oriented to person, place, and time.     Coordination: Coordination normal.  Psychiatric:        Behavior: Behavior normal. Behavior is cooperative.        Thought Content: Thought content normal.        Judgment: Judgment normal.          Patient has been counseled extensively about nutrition and exercise as well as the importance of adherence with medications and regular  follow-up. The patient was given clear instructions to go to ER or return to medical center if symptoms don't improve, worsen or new problems develop. The patient verbalized understanding.   Follow-up: Return in about 6 months (around 10/28/2024).   Haze LELON Servant, FNP-BC [facility] and [facility] [place], [place] [phone]   05/03/2024, 12:33 AM

## 2024-05-01 LAB — CMP14+EGFR
ALT: 39 IU/L (ref 0–44)
AST: 22 IU/L (ref 0–40)
Albumin: 4.2 g/dL (ref 3.8–4.9)
Alkaline Phosphatase: 153 IU/L — ABNORMAL HIGH (ref 47–123)
BUN/Creatinine Ratio: 14 (ref 10–24)
BUN: 14 mg/dL (ref 8–27)
Bilirubin Total: 0.7 mg/dL (ref 0.0–1.2)
CO2: 23 mmol/L (ref 20–29)
Calcium: 9.4 mg/dL (ref 8.6–10.2)
Chloride: 102 mmol/L (ref 96–106)
Creatinine, Ser: 0.97 mg/dL (ref 0.76–1.27)
Globulin, Total: 3.4 g/dL (ref 1.5–4.5)
Glucose: 118 mg/dL — ABNORMAL HIGH (ref 70–99)
Potassium: 4.3 mmol/L (ref 3.5–5.2)
Sodium: 140 mmol/L (ref 134–144)
Total Protein: 7.6 g/dL (ref 6.0–8.5)
eGFR: 89 mL/min/1.73 (ref >59)

## 2024-05-01 LAB — HEMOGLOBIN A1C
Est. average glucose Bld gHb Est-mCnc: 154 mg/dL
Hgb A1c MFr Bld: 7 % — ABNORMAL HIGH (ref 4.8–5.6)

## 2024-05-03 ENCOUNTER — Ambulatory Visit: Payer: Self-pay | Admitting: Nurse Practitioner

## 2024-05-03 MED ORDER — SEMAGLUTIDE (1 MG/DOSE) 4 MG/3ML ~~LOC~~ SOPN: 1.0000 mg | PEN_INJECTOR | SUBCUTANEOUS | 1 refills | Status: DC

## 2024-05-28 ENCOUNTER — Telehealth: Payer: Self-pay

## 2024-05-28 DIAGNOSIS — Z7985 Long-term (current) use of injectable non-insulin antidiabetic drugs: Secondary | ICD-10-CM

## 2024-05-28 MED ORDER — SEMAGLUTIDE (1 MG/DOSE) 4 MG/3ML ~~LOC~~ SOPN: 1.0000 mg | PEN_INJECTOR | SUBCUTANEOUS | 1 refills | Status: DC

## 2024-05-28 NOTE — Telephone Encounter (Signed)
 Prescription has been sent to the pharmacy.

## 2024-05-28 NOTE — Telephone Encounter (Signed)
 Copied from CRM #[id number]. Topic: Clinical - Medication Question >> May 28, 2024  8:23 AM Avram MATSU wrote: Reason for CRM: patient stated his rx dose (1g) has increase and received a one month supply. Patient would ike to know if he can get a 3 month supply since it would be the same price as the one month. [facility]  Semaglutide ,0.25 or 0.5MG /DOS, (OZEMPIC , 0.25 OR 0.5 MG/DOSE,) 2 MG/3ML SOPN [[id number]]

## 2024-05-28 NOTE — Telephone Encounter (Signed)
 Routing to PCP for review.

## 2024-05-28 NOTE — Addendum Note (Signed)
 Addended by: Roverto Bodmer on: 05/28/2024 01:29 PM   Modules accepted: Orders

## 2024-05-28 NOTE — Telephone Encounter (Signed)
 Patient has been informed of medication being sent to pharmacy.

## 2024-05-31 ENCOUNTER — Other Ambulatory Visit: Payer: Self-pay | Admitting: Family Medicine

## 2024-05-31 DIAGNOSIS — E1169 Type 2 diabetes mellitus with other specified complication: Secondary | ICD-10-CM

## 2024-06-18 NOTE — Telephone Encounter (Signed)
 Copied from CRM [id number]. Topic: Clinical - Prescription Issue >> Jun 18, 2024  9:26 AM Rea ORN wrote:  Reason for CRM: Pt stated his Ozempic  rx was sent to the wrong pharmacy last month. He stated the 3 month supply rx should have been sent to [facility] but it was sent to local [facility]. Pt stated the price for Ozempic  is cheaper at [facility] and would like the rx sent there instead.   Please call back to advise, [phone]

## 2024-06-18 NOTE — Telephone Encounter (Signed)
Sending as a Financial planner

## 2024-06-19 DIAGNOSIS — Z7985 Long-term (current) use of injectable non-insulin antidiabetic drugs: Secondary | ICD-10-CM

## 2024-06-19 MED ORDER — SEMAGLUTIDE (1 MG/DOSE) 4 MG/3ML ~~LOC~~ SOPN: 1.0000 mg | PEN_INJECTOR | SUBCUTANEOUS | 1 refills | Status: AC

## 2024-06-19 NOTE — Telephone Encounter (Signed)
 Medication has been changed to pharmacy.

## 2024-07-01 ENCOUNTER — Ambulatory Visit (HOSPITAL_COMMUNITY): Admission: EM | Admit: 2024-07-01 | Discharge: 2024-07-01 | Disposition: A | Discharge status: Home / Self Care

## 2024-07-01 ENCOUNTER — Encounter (HOSPITAL_COMMUNITY): Payer: Self-pay

## 2024-07-01 DIAGNOSIS — R21 Rash and other nonspecific skin eruption: Secondary | ICD-10-CM | POA: Diagnosis not present

## 2024-07-01 MED ORDER — FEXOFENADINE HCL 180 MG PO TABS: 180.0000 mg | ORAL_TABLET | Freq: Every day | ORAL | 0 refills | Status: AC

## 2024-07-01 MED ORDER — PREDNISONE 20 MG PO TABS: ORAL_TABLET | ORAL | 0 refills | Status: AC

## 2024-07-01 MED ORDER — TRIAMCINOLONE ACETONIDE 0.1 % EX CREA: 1.0000 | TOPICAL_CREAM | Freq: Two times a day (BID) | CUTANEOUS | 0 refills | Status: AC

## 2024-07-01 NOTE — ED Triage Notes (Signed)
 Patient report itchy rash on his left arm x 1 week.Patient has been using Hydrocortisone cream.

## 2024-07-01 NOTE — ED Provider Notes (Signed)
 " [facility]  Note:  This document was prepared using Dragon voice recognition software and may include unintentional dictation errors.  MRN: [id number] DOB: [birth year]  Subjective:   Thomas Cummings is a 59 y.o. male presenting for pruritic, erythematous rash to left arm x 1 week.  Patient has been applying Neosporin to the rash with no improvement.  Denies any known exposure to any allergic substance or chemicals.  Denies any change in skin care or personal hygiene products.  Patient denies any pain, fever, weakness, dizziness.  Patient denies that rash is spread to any other part of the body has just noticed rash over her entire left arm.  Current Medications[1]   Allergies[2]  Past Medical History:  Diagnosis Date   BMI 40.0-44.9, adult (HCC)    Diabetes mellitus due to underlying condition with hyperosmolarity without coma, without long-term current use of insulin (HCC)    Essential hypertension    Hyperlipidemia    Phreesia 02/19/2020   Hypertension    Phreesia 02/19/2020   Mixed hyperlipidemia    OSA (obstructive sleep apnea)    Sleep apnea      Past Surgical History:  Procedure Laterality Date   SHOULDER SURGERY Right    2022   WRIST SURGERY Left    small knot removed    Family History  Problem Relation Age of Onset   Diabetes Mother    Alzheimer's disease Father    Colon cancer Neg Hx    Rectal cancer Neg Hx    Stomach cancer Neg Hx    Esophageal cancer Neg Hx     Social History[3]  ROS Refer to HPI for ROS details.  Objective:    Vitals: BP (!) 159/99 (BP Location: Left Arm)   Pulse 79   Temp 98 F (36.7 C) (Oral)   Resp 18   SpO2 96%   Physical Exam Vitals and nursing note reviewed.  Constitutional:      General: He is not in acute distress.    Appearance: He is well-developed. He is not ill-appearing or toxic-appearing.  HENT:     Head: Normocephalic.  Cardiovascular:     Rate and Rhythm: Normal rate.  Pulmonary:      Effort: Pulmonary effort is normal. No respiratory distress.     Breath sounds: No stridor. No wheezing.  Skin:    General: Skin is warm and dry.     Capillary Refill: Capillary refill takes less than 2 seconds.     Findings: Erythema and rash present. Rash is papular and urticarial.  Neurological:     General: No focal deficit present.     Mental Status: He is alert and oriented to person, place, and time.  Psychiatric:        Mood and Affect: Mood normal.        Behavior: Behavior normal.     Procedures  No results found for this or any previous visit (from the past 24 hours).  Assessment and Plan :     Discharge Instructions       1. Rash and nonspecific skin eruption (Primary) - triamcinolone  cream (KENALOG ) 0.1 %; Apply 1 Application topically 2 (two) times daily.  Dispense: 30 g; Refill: 0 - predniSONE  (DELTASONE ) 20 MG tablet; Take 3 tablets (60 mg total) by mouth daily for 3 days, THEN 2 tablets (40 mg total) daily for 4 days, THEN 1 tablet (20 mg total) daily for 3 days.  Dispense: 20 tablet; Refill: 0 - fexofenadine  (ALLEGRA ) 180  MG tablet; Take 1 tablet (180 mg total) by mouth daily.  Dispense: 30 tablet; Refill: 0  -Continue to monitor symptoms for any change in severity if there is any escalation of current symptoms or development of new symptoms follow-up in ER for further evaluation and management.      [name] B [name]    [1] No current facility-administered medications for this encounter.  Current Outpatient Medications:    amLODipine  (NORVASC ) 10 MG tablet, Take 1 tablet (10 mg total) by mouth daily., Disp: 90 tablet, Rfl: 1   atorvastatin  (LIPITOR) 40 MG tablet, Take 1 tablet by mouth once daily, Disp: 90 tablet, Rfl: 2   carvedilol  (COREG ) 25 MG tablet, Take 1 tablet (25 mg total) by mouth 2 (two) times daily., Disp: 180 tablet, Rfl: 1   fexofenadine  (ALLEGRA ) 180 MG tablet, Take 1 tablet (180 mg total) by mouth daily., Disp: 30 tablet, Rfl: 0    lisinopril  (ZESTRIL ) 40 MG tablet, Take 1 tablet (40 mg total) by mouth daily., Disp: 90 tablet, Rfl: 1   metFORMIN  (GLUCOPHAGE ) 1000 MG tablet, Take 1 tablet (1,000 mg total) by mouth 2 (two) times daily with a meal., Disp: 180 tablet, Rfl: 1   Misc. Devices MISC, CPAP machine on AutoPap 5 to 15 cm water.  Diagnosis obstructive sleep apnea, Disp: 1 each, Rfl: 0   predniSONE  (DELTASONE ) 20 MG tablet, Take 3 tablets (60 mg total) by mouth daily for 3 days, THEN 2 tablets (40 mg total) daily for 4 days, THEN 1 tablet (20 mg total) daily for 3 days., Disp: 20 tablet, Rfl: 0   Semaglutide , 1 MG/DOSE, 4 MG/3ML SOPN, Inject 1 mg as directed once a week., Disp: 9 mL, Rfl: 1   triamcinolone  cream (KENALOG ) 0.1 %, Apply 1 Application topically 2 (two) times daily., Disp: 30 g, Rfl: 0 [2] No Known Allergies [3]  Social History Tobacco Use   Smoking status: Never    Passive exposure: Never   Smokeless tobacco: Never  Vaping Use   Vaping status: Never Used  Substance Use Topics   Alcohol use: Not Currently   Drug use: Never     [name] B, NP 07/01/24 [phone]  "

## 2024-07-01 NOTE — Discharge Instructions (Signed)
" °  1. Rash and nonspecific skin eruption (Primary) - triamcinolone  cream (KENALOG ) 0.1 %; Apply 1 Application topically 2 (two) times daily.  Dispense: 30 g; Refill: 0 - predniSONE  (DELTASONE ) 20 MG tablet; Take 3 tablets (60 mg total) by mouth daily for 3 days, THEN 2 tablets (40 mg total) daily for 4 days, THEN 1 tablet (20 mg total) daily for 3 days.  Dispense: 20 tablet; Refill: 0 - fexofenadine  (ALLEGRA ) 180 MG tablet; Take 1 tablet (180 mg total) by mouth daily.  Dispense: 30 tablet; Refill: 0  -Continue to monitor symptoms for any change in severity if there is any escalation of current symptoms or development of new symptoms follow-up in ER for further evaluation and management. "

## 2024-10-29 ENCOUNTER — Ambulatory Visit: Admitting: Family Medicine
